# Patient Record
Sex: Female | Born: 1965 | Race: White | Hispanic: No | Marital: Married | Smoking: Never smoker
Health system: Southern US, Community
[De-identification: ages and names within clinical notes are randomized; demographics above are authoritative.]

## PROBLEM LIST (undated history)

## (undated) DIAGNOSIS — K589 Irritable bowel syndrome without diarrhea: Secondary | ICD-10-CM

## (undated) DIAGNOSIS — R011 Cardiac murmur, unspecified: Secondary | ICD-10-CM

## (undated) DIAGNOSIS — K219 Gastro-esophageal reflux disease without esophagitis: Secondary | ICD-10-CM

## (undated) DIAGNOSIS — J309 Allergic rhinitis, unspecified: Secondary | ICD-10-CM

## (undated) DIAGNOSIS — F419 Anxiety disorder, unspecified: Secondary | ICD-10-CM

## (undated) DIAGNOSIS — Z808 Family history of malignant neoplasm of other organs or systems: Secondary | ICD-10-CM

## (undated) DIAGNOSIS — E785 Hyperlipidemia, unspecified: Secondary | ICD-10-CM

## (undated) DIAGNOSIS — M545 Low back pain, unspecified: Secondary | ICD-10-CM

## (undated) DIAGNOSIS — Z800 Family history of malignant neoplasm of digestive organs: Secondary | ICD-10-CM

## (undated) DIAGNOSIS — Z803 Family history of malignant neoplasm of breast: Secondary | ICD-10-CM

## (undated) HISTORY — DX: Family history of malignant neoplasm of breast: Z80.3

## (undated) HISTORY — DX: Family history of malignant neoplasm of digestive organs: Z80.0

## (undated) HISTORY — DX: Anxiety disorder, unspecified: F41.9

## (undated) HISTORY — DX: Hyperlipidemia, unspecified: E78.5

## (undated) HISTORY — DX: Gastro-esophageal reflux disease without esophagitis: K21.9

## (undated) HISTORY — DX: Low back pain: M54.5

## (undated) HISTORY — DX: Low back pain, unspecified: M54.50

## (undated) HISTORY — DX: Cardiac murmur, unspecified: R01.1

## (undated) HISTORY — DX: Irritable bowel syndrome, unspecified: K58.9

## (undated) HISTORY — DX: Family history of malignant neoplasm of other organs or systems: Z80.8

## (undated) HISTORY — PX: EYE SURGERY: SHX253

## (undated) HISTORY — DX: Allergic rhinitis, unspecified: J30.9

## (undated) HISTORY — PX: TUBAL LIGATION: SHX77

## (undated) HISTORY — PX: COLONOSCOPY: SHX174

## (undated) HISTORY — PX: VAGINAL HYSTERECTOMY: SUR661

---

## 1999-05-31 ENCOUNTER — Other Ambulatory Visit: Admission: RE | Admit: 1999-05-31 | Discharge: 1999-05-31 | Payer: Self-pay | Admitting: Obstetrics and Gynecology

## 2001-07-27 ENCOUNTER — Other Ambulatory Visit: Admission: RE | Admit: 2001-07-27 | Discharge: 2001-07-27 | Payer: Self-pay | Admitting: Obstetrics and Gynecology

## 2003-10-09 ENCOUNTER — Other Ambulatory Visit: Admission: RE | Admit: 2003-10-09 | Discharge: 2003-10-09 | Payer: Self-pay | Admitting: Obstetrics and Gynecology

## 2004-10-20 ENCOUNTER — Other Ambulatory Visit: Admission: RE | Admit: 2004-10-20 | Discharge: 2004-10-20 | Payer: Self-pay | Admitting: Obstetrics and Gynecology

## 2004-12-01 ENCOUNTER — Encounter (INDEPENDENT_AMBULATORY_CARE_PROVIDER_SITE_OTHER): Payer: Self-pay | Admitting: Specialist

## 2004-12-01 ENCOUNTER — Observation Stay (HOSPITAL_COMMUNITY): Admission: RE | Admit: 2004-12-01 | Discharge: 2004-12-02 | Payer: Self-pay | Admitting: Obstetrics and Gynecology

## 2005-10-19 ENCOUNTER — Ambulatory Visit: Payer: Self-pay | Admitting: Pulmonary Disease

## 2005-12-07 ENCOUNTER — Other Ambulatory Visit: Admission: RE | Admit: 2005-12-07 | Discharge: 2005-12-07 | Payer: Self-pay | Admitting: Obstetrics and Gynecology

## 2005-12-28 ENCOUNTER — Ambulatory Visit: Payer: Self-pay | Admitting: Pulmonary Disease

## 2006-08-16 ENCOUNTER — Ambulatory Visit: Payer: Self-pay | Admitting: Pulmonary Disease

## 2007-01-01 ENCOUNTER — Ambulatory Visit: Payer: Self-pay | Admitting: Pulmonary Disease

## 2007-01-01 LAB — CONVERTED CEMR LAB
AST: 22 units/L (ref 0–37)
Basophils Relative: 0.5 % (ref 0.0–1.0)
CO2: 29 meq/L (ref 19–32)
Calcium: 9.2 mg/dL (ref 8.4–10.5)
Chol/HDL Ratio, serum: 2.5
Creatinine, Ser: 0.7 mg/dL (ref 0.4–1.2)
Eosinophil percent: 3 % (ref 0.0–5.0)
GFR calc non Af Amer: 98 mL/min
Glomerular Filtration Rate, Af Am: 118 mL/min/{1.73_m2}
Hemoglobin: 12 g/dL (ref 12.0–15.0)
LDL Cholesterol: 83 mg/dL (ref 0–99)
MCHC: 33.3 g/dL (ref 30.0–36.0)
MCV: 90.5 fL (ref 78.0–100.0)
Monocytes Absolute: 0.5 10*3/uL (ref 0.2–0.7)
Monocytes Relative: 10.1 % (ref 3.0–11.0)
Neutro Abs: 2.9 10*3/uL (ref 1.4–7.7)
RDW: 12.5 % (ref 11.5–14.6)
TSH: 2.72 microintl units/mL (ref 0.35–5.50)
Total Bilirubin: 0.9 mg/dL (ref 0.3–1.2)
Total Protein: 6.7 g/dL (ref 6.0–8.3)
Triglyceride fasting, serum: 46 mg/dL (ref 0–149)
VLDL: 9 mg/dL (ref 0–40)
WBC: 4.6 10*3/uL (ref 4.5–10.5)

## 2008-03-14 ENCOUNTER — Telehealth (INDEPENDENT_AMBULATORY_CARE_PROVIDER_SITE_OTHER): Payer: Self-pay | Admitting: *Deleted

## 2008-04-14 DIAGNOSIS — J309 Allergic rhinitis, unspecified: Secondary | ICD-10-CM | POA: Insufficient documentation

## 2008-04-14 DIAGNOSIS — K219 Gastro-esophageal reflux disease without esophagitis: Secondary | ICD-10-CM

## 2008-04-14 DIAGNOSIS — K589 Irritable bowel syndrome without diarrhea: Secondary | ICD-10-CM

## 2008-04-14 DIAGNOSIS — F411 Generalized anxiety disorder: Secondary | ICD-10-CM

## 2008-04-14 DIAGNOSIS — M545 Low back pain: Secondary | ICD-10-CM

## 2008-04-14 DIAGNOSIS — E7800 Pure hypercholesterolemia, unspecified: Secondary | ICD-10-CM

## 2008-04-15 ENCOUNTER — Ambulatory Visit: Payer: Self-pay | Admitting: Pulmonary Disease

## 2008-04-16 ENCOUNTER — Ambulatory Visit: Payer: Self-pay | Admitting: Pulmonary Disease

## 2008-04-20 LAB — CONVERTED CEMR LAB
ALT: 18 units/L (ref 0–35)
AST: 22 units/L (ref 0–37)
Alkaline Phosphatase: 41 units/L (ref 39–117)
BUN: 10 mg/dL (ref 6–23)
CO2: 29 meq/L (ref 19–32)
Direct LDL: 140.6 mg/dL
Eosinophils Absolute: 0.2 10*3/uL (ref 0.0–0.7)
GFR calc Af Amer: 117 mL/min
GFR calc non Af Amer: 97 mL/min
Glucose, Bld: 86 mg/dL (ref 70–99)
HCT: 36.8 % (ref 36.0–46.0)
MCHC: 33.9 g/dL (ref 30.0–36.0)
Monocytes Relative: 10 % (ref 3.0–12.0)
Neutrophils Relative %: 56.7 % (ref 43.0–77.0)
Sodium: 139 meq/L (ref 135–145)
TSH: 2.41 microintl units/mL (ref 0.35–5.50)
Total Bilirubin: 0.9 mg/dL (ref 0.3–1.2)
Total CHOL/HDL Ratio: 3.5

## 2008-10-07 ENCOUNTER — Telehealth (INDEPENDENT_AMBULATORY_CARE_PROVIDER_SITE_OTHER): Payer: Self-pay | Admitting: *Deleted

## 2008-10-09 ENCOUNTER — Ambulatory Visit: Payer: Self-pay | Admitting: Pulmonary Disease

## 2008-10-16 ENCOUNTER — Telehealth: Payer: Self-pay | Admitting: Pulmonary Disease

## 2008-10-16 LAB — CONVERTED CEMR LAB
ALT: 19 units/L (ref 0–35)
Alkaline Phosphatase: 49 units/L (ref 39–117)
Cholesterol: 156 mg/dL (ref 0–200)
HDL: 65.9 mg/dL (ref >39.0)
Total Bilirubin: 0.9 mg/dL (ref 0.3–1.2)
Triglycerides: 49 mg/dL (ref 0–149)

## 2009-09-01 ENCOUNTER — Telehealth (INDEPENDENT_AMBULATORY_CARE_PROVIDER_SITE_OTHER): Payer: Self-pay | Admitting: *Deleted

## 2009-10-09 ENCOUNTER — Ambulatory Visit: Payer: Self-pay | Admitting: Pulmonary Disease

## 2009-10-10 LAB — CONVERTED CEMR LAB
AST: 25 units/L (ref 0–37)
Alkaline Phosphatase: 50 units/L (ref 39–117)
Basophils Relative: 0.9 % (ref 0.0–3.0)
Bilirubin Urine: NEGATIVE
Bilirubin, Direct: 0.1 mg/dL (ref 0.0–0.3)
CO2: 29 meq/L (ref 19–32)
Calcium: 9.3 mg/dL (ref 8.4–10.5)
Cholesterol: 161 mg/dL (ref 0–200)
Eosinophils Relative: 1.6 % (ref 0.0–5.0)
Glucose, Bld: 88 mg/dL (ref 70–99)
Hemoglobin: 13.4 g/dL (ref 12.0–15.0)
Ketones, ur: NEGATIVE mg/dL
Leukocytes, UA: NEGATIVE
Lymphocytes Relative: 25.1 % (ref 12.0–46.0)
Lymphs Abs: 1.5 10*3/uL (ref 0.7–4.0)
MCHC: 34.4 g/dL (ref 30.0–36.0)
MCV: 94.3 fL (ref 78.0–100.0)
Monocytes Relative: 10 % (ref 3.0–12.0)
Nitrite: NEGATIVE
Potassium: 4.1 meq/L (ref 3.5–5.1)
RDW: 11.9 % (ref 11.5–14.6)
Specific Gravity, Urine: 1.015 (ref 1.000–1.030)
TSH: 2.26 microintl units/mL (ref 0.35–5.50)
Total Protein, Urine: NEGATIVE mg/dL
Triglycerides: 67 mg/dL (ref 0.0–149.0)
Urobilinogen, UA: 0.2 (ref 0.0–1.0)
WBC: 5.9 10*3/uL (ref 4.5–10.5)
pH: 7 (ref 5.0–8.0)

## 2010-12-23 ENCOUNTER — Telehealth (INDEPENDENT_AMBULATORY_CARE_PROVIDER_SITE_OTHER): Payer: Self-pay | Admitting: *Deleted

## 2011-01-20 NOTE — Progress Notes (Signed)
Summary: refill and lab appt-PT RETURNED CALL FROM TRIAGE  Phone Note Call from Patient Call back at Work Phone [phone]   Caller: Patient Call For: nadel Reason for Call: Refill Medication Summary of Call: Pt requests refill on simvastatin 20mg  has appt on 2/24 and wants to schedule her labs prior to this appt.//[facility] [address] Initial call taken by: Darletta Moll,  December 23, 2010 1:34 PM  Follow-up for Phone Call        lmomtcb. need to verify medication request and pharmacy. Zackery Barefoot CMA  December 23, 2010 3:55 PM   pt returned call from triage. she confirmed that she wanted simvastatin 20mg  for a 30 day supply w/ [facility] [address]. "If" dr Kriste Basque agrees for a 90 day supply she would like this through [facility]. she says she assumed that the reason the rx was refused last (for 90 days supply req) was because she needed to be seen. pt does have an appt pend w/ nadel. call cell [phone] at 5:20 and pt can speak to nurse then. thanks. Tivis Ringer, CNA  December 23, 2010 5:08 PM  Additional Follow-up for Phone Call Additional follow up Details #1::        Spoke with pt and advised that 90 day supply was sent to  [facility] for simvastatin, needs to be sure and keep appt for refills.  She wants to come in the wk of 02/07/11 to have fasting labs.  Pls advise what labs to order in [facility] thanks! Additional Follow-up by: Vernie Murders,  December 23, 2010 5:17 PM    Additional Follow-up for Phone Call Additional follow up Details #2::    per SN---labs ok for lip-bmp-hepat-cbcd-tsh---v70.0.  thanks Randell Loop CMA  December 24, 2010 9:52 AM   [facility] for pt that lab orders were in computer for week of 02-07-11.Abigail Miyamoto RN  December 24, 2010 10:24 AM   Prescriptions: SIMVASTATIN 20 MG TABS (SIMVASTATIN) 1 by mouth at bedtime  #90 x 0   Entered by:   Vernie Murders   Authorized by:   Michele Mcalpine MD   Signed by:   Vernie Murders on 12/23/2010   Method used:   Electronically to   [facility]* (retail)             ,          Ph: [phone]       Fax: [phone]   RxID:   [id number]

## 2011-02-08 ENCOUNTER — Other Ambulatory Visit: Payer: 59

## 2011-02-08 ENCOUNTER — Other Ambulatory Visit: Payer: Self-pay | Admitting: Pulmonary Disease

## 2011-02-08 ENCOUNTER — Encounter (INDEPENDENT_AMBULATORY_CARE_PROVIDER_SITE_OTHER): Payer: Self-pay | Admitting: *Deleted

## 2011-02-08 DIAGNOSIS — Z0000 Encounter for general adult medical examination without abnormal findings: Secondary | ICD-10-CM

## 2011-02-08 LAB — BASIC METABOLIC PANEL
BUN: 19 mg/dL (ref 6–23)
CO2: 29 mEq/L (ref 19–32)
Calcium: 9.7 mg/dL (ref 8.4–10.5)
Creatinine, Ser: 0.6 mg/dL (ref 0.4–1.2)
Glucose, Bld: 80 mg/dL (ref 70–99)
Potassium: 5.2 mEq/L — ABNORMAL HIGH (ref 3.5–5.1)
Sodium: 139 mEq/L (ref 135–145)

## 2011-02-08 LAB — LIPID PANEL
HDL: 70.2 mg/dL (ref >39.00)
LDL Cholesterol: 107 mg/dL — ABNORMAL HIGH (ref 0–99)

## 2011-02-08 LAB — HEPATIC FUNCTION PANEL
ALT: 17 U/L (ref 0–35)
Alkaline Phosphatase: 40 U/L (ref 39–117)
Bilirubin, Direct: 0.1 mg/dL (ref 0.0–0.3)
Total Protein: 6.9 g/dL (ref 6.0–8.3)

## 2011-02-08 LAB — CBC WITH DIFFERENTIAL/PLATELET
Basophils Absolute: 0 10*3/uL (ref 0.0–0.1)
Eosinophils Absolute: 0.1 10*3/uL (ref 0.0–0.7)
Lymphocytes Relative: 25.6 % (ref 12.0–46.0)
MCHC: 34.1 g/dL (ref 30.0–36.0)
Neutrophils Relative %: 62.7 % (ref 43.0–77.0)
Platelets: 274 10*3/uL (ref 150.0–400.0)
RDW: 13.1 % (ref 11.5–14.6)

## 2011-02-11 ENCOUNTER — Encounter (INDEPENDENT_AMBULATORY_CARE_PROVIDER_SITE_OTHER): Payer: 59 | Admitting: Pulmonary Disease

## 2011-02-11 ENCOUNTER — Encounter: Payer: Self-pay | Admitting: Pulmonary Disease

## 2011-02-11 DIAGNOSIS — Z0000 Encounter for general adult medical examination without abnormal findings: Secondary | ICD-10-CM

## 2011-03-01 NOTE — Assessment & Plan Note (Signed)
Summary: cpx/jd   CC:  16 month ROV & CPX....  History of Present Illness: 45 y/o WF here for a follow up visit and CPX...    ~  [name]:  she was last seen [name] doing well... we decided to start Lipitor 10mg /d for her Chol with a great response, but she has since discontinued this med in favor of diet alone... we did f/u FLP w/ TChol 227, LDL 141- started Simva20...   ~  October 09, 2009:  she's had a good 43mo, tolerating Simva20 & working well... had plantar fasciitis w/ eval by [facility]- stretching, PT, NSAIDs Prn... no new complaints or concerns...   ~  February 11, 2011:  here for CPX & 58mo ROV doing well w/o new complaints or concerns... she does c/o some incr gas & we discussed antigas rx w/ simethacone, etc... she continues to do well on the Simva20...    Current Problem List  PHYSICAL EXAMINATION (ICD-V70.0) - GYN = DrTomblin for Pap, Mammogram, etc... she is up to date w/ her check ups...  ALLERGIC RHINITIS (ICD-477.9) - OTC Claritin/ Zyrtek as needed.  HYPERCHOLESTEROLEMIA (ICD-272.0) - now on SIMVASTATIN 20mg /d...   ~  prev FLP's w/ TChol 230-240, TG 60's, HDL 68-70, LDL 160 range.  ~  FLP on statin Rx showed TChol 150-170, TG 3-60, HDL 61-67, LDL 83-92  ~  FLP 4/09 showed TChol 227, TG 58, HDL 65, LDL 141... rec> start SIMVA20.  ~  FLP 10/09 on Simva20 showed TChol 156, TG 49, HDL 66, LDL 80  ~  FLP 10/10 on Simva20 showed TChol 161, TG 67, HDL 59, LDL 89  ~  FLP 2/12 on Simva20 showed TCHol 198, TG 102, HDL 70, LDL 107... we reviewed diet/ exercise.  GERD (ICD-530.81)no recent problems... prev used Prilosec vs Ranitadine...  IRRITABLE BOWEL SYNDROME (ICD-564.1) - on recent symptoms, uses fiber Prn...  BACK PAIN, LUMBAR (ICD-724.2) - hx lumbar disc dis, no surgery... developed plantar fasciitis 2010 w/ eval by [facility]- stretching, PT, NSAIDs Prn...  ANXIETY (ICD-300.00)   Preventive Screening-Counseling & Management  Alcohol-Tobacco     Smoking Status:  never  Allergies: 1)  ! Codeine  Comments:  Nurse/Medical Assistant: The patient's medications and allergies were reviewed with the patient and were updated in the Medication and Allergy Lists.  Past History:  Past Medical History: ALLERGIC RHINITIS (ICD-477.9) HYPERCHOLESTEROLEMIA (ICD-272.0) GERD (ICD-530.81) IRRITABLE BOWEL SYNDROME (ICD-564.1) BACK PAIN, LUMBAR (ICD-724.2) ANXIETY (ICD-300.00)  Family History: Reviewed history from 10/09/2009 and no changes required. Father alive age 58 w/ hx hypercholesterolemia on meds. Mother died age 63 w/ pancreatic cancer & hx breast cancer. 1 Sis w/ hx psyche prob/ anxiety. Note: hx of colon cancer in 2 uncles and 1 aunt.  Social History: Reviewed history from 10/09/2009 and no changes required. Married, husb= Onalee Hua (fireman), 78yrs. 2 Children- sons aged 94 & 83, both interested in [place]. never smoked social alcohol  exercises 3 times per week Employ: systems analyst [employer]  Review of Systems       The patient complains of gas/bloating.  The patient denies fever, chills, sweats, anorexia, fatigue, weakness, malaise, weight loss, sleep disorder, blurring, diplopia, eye irritation, eye discharge, vision loss, eye pain, photophobia, earache, ear discharge, tinnitus, decreased hearing, nasal congestion, nosebleeds, sore throat, hoarseness, chest pain, palpitations, syncope, dyspnea on exertion, orthopnea, PND, peripheral edema, cough, dyspnea at rest, excessive sputum, hemoptysis, wheezing, pleurisy, nausea, vomiting, diarrhea, constipation, change in bowel habits, abdominal pain, melena, hematochezia, jaundice, indigestion/heartburn, dysphagia, odynophagia, dysuria,  hematuria, urinary frequency, urinary hesitancy, nocturia, incontinence, back pain, joint pain, joint swelling, muscle cramps, muscle weakness, stiffness, arthritis, sciatica, restless legs, leg pain at night, leg pain with exertion, rash, itching, dryness,  suspicious lesions, paralysis, paresthesias, seizures, tremors, vertigo, transient blindness, frequent falls, frequent headaches, difficulty walking, depression, anxiety, memory loss, confusion, cold intolerance, heat intolerance, polydipsia, polyphagia, polyuria, unusual weight change, abnormal bruising, bleeding, enlarged lymph nodes, urticaria, allergic rash, hay fever, and recurrent infections.    Vital Signs:  Patient profile:   45 year old female Height:      66.5 inches Weight:      146.38 pounds BMI:     23.36 O2 Sat:      99 % on Room air Temp:     98.3 degrees F oral Pulse rate:   60 / minute BP sitting:   108 / 68  (left arm) Cuff size:   regular  Vitals Entered By: Randell Loop CMA (February 11, 2011 11:07 AM)  O2 Sat at Rest %:  99 O2 Flow:  Room air CC: 16 month ROV & CPX... Is Patient Diabetic? No Pain Assessment Patient in pain? no      Comments meds updated today with pt   Physical Exam  Additional Exam:  WD, WN, 45 y/o WF in NAD... GENERAL:  Alert & oriented; pleasant & cooperative... HEENT:  [place]/AT, EOM-wnl, PERRLA, EACs-clear, TMs-wnl, NOSE-clear, THROAT-clear & wnl. NECK:  Supple w/ full ROM; no JVD; normal carotid impulses w/o bruits; no thyromegaly or nodules palpated; no lymphadenopathy. CHEST:  Clear to P & A; without wheezes/ rales/ or rhonchi. HEART:  Regular Rhythm; without murmurs/ rubs/ or gallops. ABDOMEN:  Soft & nontender; normal bowel sounds; no organomegaly or masses detected. EXT: without deformities or arthritic changes; no varicose veins/ venous insuffic/ or edema. NEURO:  CN's intact; motor testing normal; sensory testing normal; gait normal & balance OK. DERM:  No lesions noted; no rash etc...    Impression & Recommendations:  Problem # 1:  PHYSICAL EXAMINATION (ICD-V70.0) Good general health... Orders: EKG w/ Interpretation (93000) Fasting labs all done 02/08/11 & copy given to pt...  Problem # 2:  HYPERCHOLESTEROLEMIA  (ICD-272.0) Stable on the Simva20>  needs better diet + exercise, same med for now. Her updated medication list for this problem includes:    Simvastatin 20 Mg Tabs (Simvastatin) .Marland Kitchen... 1 by mouth at bedtime  Problem # 3:  GERD (ICD-530.81) GI stable on OTC meds Prn... advised Simethacone for gas related symptoms...  Problem # 4:  OTHER MEDICAL ISSUES AS NOTED>>>  Complete Medication List: 1)  Simvastatin 20 Mg Tabs (Simvastatin) .Marland Kitchen.. 1 by mouth at bedtime  Patient Instructions: 1)  Today we updated your med list- see below.... 2)  We refilled your Simvastatin... Keep up the good work w/ diet & exercise.Marland KitchenMarland Kitchen 3)  Call for any problems.Marland KitchenMarland Kitchen 4)  Please schedule a follow-up appointment in 1 year. Prescriptions: SIMVASTATIN 20 MG TABS (SIMVASTATIN) 1 by mouth at bedtime  #90 x 4   Entered and Authorized by:   Michele Mcalpine MD   Signed by:   Michele Mcalpine MD on 02/11/2011   Method used:   Print then Give to Patient   RxID:   [id number]

## 2011-05-06 NOTE — Discharge Summary (Signed)
Kylie Miller, Kylie Miller                ACCOUNT NO.:  [id number]   MEDICAL RECORD NO.:  [id number]          PATIENT TYPE:  OBV   LOCATION:  9318                          FACILITY:  [facility]   PHYSICIAN:  Guy Sandifer. Tomblin II, M.D.DATE OF BIRTH:  Apr 23, 1966   DATE OF ADMISSION:  12/01/2004  DATE OF DISCHARGE:                                 DISCHARGE SUMMARY   ADMITTING DIAGNOSIS:  Menorrhagia.   DISCHARGE DIAGNOSIS:  Menorrhagia.   PROCEDURE:  On December 01, 2004 - laparoscopic-assisted vaginal  hysterectomy.   REASON FOR ADMISSION:  This patient is a 45 year old married white female G2  P2 status post tubal ligation with increasingly heavy menses.  Details are  dictated in the History and Physical.  She is admitted to the hospital for  surgical management.   HOSPITAL COURSE:  The patient is admitted to the hospital, undergoes the  above procedure.  On the evening of surgery, she has good pain relief,  tolerating liquids well, but not yet passing flatus.  Vital signs are  stable.  She is afebrile with clear urine output.  On postoperative day #1,  she is tolerating a regular diet, passing flatus, ambulating well.  Vital  signs are stable, she remains afebrile.  White count is 7.9, hemoglobin is  10.7, and pathology is pending.   CONDITION ON DISCHARGE:  Good.   DIET:  Regular as tolerated.   ACTIVITY:  No lifting, no operation of automobiles, no vaginal entry.  She  is to call the office for problems including but not limited to heavy  vaginal bleeding, increasing pain, persistent nausea or vomiting, or  temperature of 101 degrees.   MEDICATIONS:  1.  Percocet 5/325 mg #30 one to two p.o. q.6h. p.r.n.  2.  Ibuprofen 600 mg q.6h. p.r.n.  3.  Multivitamin daily.  4.  Colace daily as needed.   Follow-up is in the office in 2 weeks.     Jame   JET/MEDQ  D:  12/02/2004  T:  12/02/2004  Job:  [id number]

## 2011-05-06 NOTE — H&P (Signed)
Kylie Miller, Kylie Miller NO.:  [id number]   MEDICAL RECORD NO.:  [id number]           PATIENT TYPE:   LOCATION:                                FACILITY:  [facility]   PHYSICIAN:  Guy Sandifer. Tomblin II, M.D.DATE OF BIRTH:  [birth year]   DATE OF ADMISSION:  12/01/2004  DATE OF DISCHARGE:                                HISTORY & PHYSICAL   CHIEF COMPLAINT:  Heavy vaginal bleeding.   HISTORY OF PRESENT ILLNESS:  The patient is a 45 year old married white  female, G2, P2, status post tubal ligation, husband also status post  vasectomy, who continues to have extremely heavy menses each month.  Ultrasound on November 4, __________ revealed the uterus measuring 9.3 x 4.1  x 6.0 cm.  Sonohysterogram was negative for intracavitary masses.  The right  ovary at the time contained a 1.7-cm simple cyst.  Options were carefully  discussed with the patient and she is being admitted for laparoscopically  assisted vaginal hysterectomy and removal of 1 tube and ovary if abnormal.   PAST MEDICAL HISTORY:  Superficial varicosities.   PAST SURGICAL HISTORY:  Negative.   OBSTETRICAL HISTORY:  Vaginal delivery x1.  Cesarean section with tubal  ligation x1.   MEDICATIONS:  Antibiotics for acne.   ALLERGIES:  No known drug allergies.   SOCIAL HISTORY:  Denies tobacco, alcohol or drug abuse.   FAMILY HISTORY:  Pancreatic and breast cancer in mother.  Colon cancer in  paternal grandfather.  Thyroid cancer in maternal grandmother.  Colon cancer  in maternal aunt.  Colon cancer in paternal uncle.  Mild epilepsy in sister.  Abnormal thyroid function in mother.   REVIEW OF SYSTEMS:  NEUROLOGIC:  Denies headache.  CARDIAC:  Denies chest  pain.  PULMONARY:  Denies shortness of breath.  GI:  Denies recent changes  in bowel habits.   PHYSICAL EXAMINATION:  VITAL SIGNS:  Height 5 feet 5-1/2 inches.  Weight 146  pounds.  Blood pressure 110/54.  HEENT/NECK:  Without thyromegaly.  LUNGS:  Clear to  auscultation.  HEART:  Regular rate and rhythm.  BACK:  Back without CVA tenderness.  BREASTS:  Breasts without masses, traction or discharge.  ABDOMEN:  Abdomen soft and nontender without masses.  PELVIC:  Vulva, vagina and cervix without lesion.  Uterus is upper limits of  normal size, mobile and nontender.  Adnexa nontender without masses.  EXTREMITIES AND NEUROLOGICAL EXAM:  Grossly within normal limits.   ASSESSMENT:  Menorrhagia.   PLAN:  Laparoscopically assisted vaginal hysterectomy, removal of tube and  ovary if abnormal.      JET/MEDQ  D:  11/22/2004  T:  11/22/2004  Job:  [id number]

## 2011-05-06 NOTE — Op Note (Signed)
Kylie Miller, Kylie Miller                ACCOUNT NO.:  [id number]   MEDICAL RECORD NO.:  [id number]          PATIENT TYPE:  OBV   LOCATION:  9399                          FACILITY:  [facility]   PHYSICIAN:  Guy Sandifer. Tomblin II, M.D.DATE OF BIRTH:  Sep 22, 1966   DATE OF PROCEDURE:  12/01/2004  DATE OF DISCHARGE:                                 OPERATIVE REPORT   PREOPERATIVE DIAGNOSIS:  Menorrhagia.   POSTOPERATIVE DIAGNOSIS:  Menorrhagia.   PROCEDURE:  Laparoscopically assisted vaginal hysterectomy.   SURGEON:  Guy Sandifer. Henderson Cloud, M.D.   ASSISTANT:  Raynald Kemp, M.D.   ANESTHESIA:  General with endotracheal intubation.   ESTIMATED BLOOD LOSS:  200 cc.   SPECIMENS:  Uterus.   INDICATIONS AND CONSENT:  This patient is a 45 year old married white female,  G2, P2, status post tubal ligation who continues to have extremely heavy  menses.  Details are dictated in the history and physical.  Laparoscopically  assisted vaginal hysterectomy and removal of the tube and ovary if abnormal  has been discussed.  The potential risks and complications have been  reviewed preoperatively, including but not limited to infection, bowel,  bladder, or ureteral damage; bleeding requiring transfusion or blood  products with possible transfusion reaction, HIV, and hepatitis acquisition;  DVT, PE, pneumonia; fistula formation; laparotomy; postoperative pain or  dyspareunia.  All questions have been answered and consent signed and on the  chart.   FINDINGS:  The upper abdomen was grossly normal.  The uterus was about six  weeks in size, smooth in contour.  The anterior and posterior cul-de-sacs  were normal.  The ovaries were normal.  The tubes were status post ligation  bilaterally.   DESCRIPTION OF PROCEDURE:  The patient was taken to the operating room where  she was identified and placed in the dorsal supine position.  General  anesthesia was induced via endotracheal intubation.  She was then placed in  the  dorsal lithotomy position where she was prepped abdominally and  vaginally.  The bladder was straight catheterized.  A Hulka tenaculum was  placed and used as a uterine manipulator, and she was draped in sterile  fashion.   A small infraumbilical incision was made, and a disposable Veress needle was  placed without difficulty.  Syringe and drop test were normal.  2 L of gas  were insufflated under low pressure.  Good tympany in the right upper  quadrant was noted.  The Veress needle was removed was replaced with a 10/11  disposable trocar sleeve.  Placement was verified with the laparoscope, and  no damage to surrounding structures were noted.  Pneumoperitoneum was  induced.  A small suprapubic incision was made, and a 5-mm disposable trocar  sleeve was placed under direct visualization without difficulty.  The above  findings were noted.  Then using the Gyrus bipolar cautery cutting  instrument, the proximal ligaments were taken down bilaterally to the level  of the vesicouterine peritoneum.  The vesicouterine peritoneum was incised  in the midline, hydrodissected, and taken down bilaterally sharply.  Good  hemostasis was maintained.  Excess  fluid was removed.  The suprapubic trocar  sleeve was removed.  Pneumoperitoneum was reduced, and attention was turned  to the vagina.   The posterior cul-de-sac was entered sharply, and the cervix was  circumscribed with unipolar cautery.  The mucosa was advanced sharply and  bluntly.  The uterosacral ligaments were taken down with the Gyrus bipolar  instrument.  The anterior cul-de-sac was entered without difficulty.  The  bladder pillars followed by the cardinal ligaments followed by the uterine  vessels were taken bilaterally.  The fundus was delivered posteriorly.  The  proximal ligaments were taken down, and the specimen was delivered.  The  uterosacral ligaments were plicated to the vagina bilaterally and then  plicated in the midline with a  separate suture.  The vaginal cuff was closed  with figure-of-eights.  A Foley catheter was placed in the bladder, and  clear urine was noted.   Attention was returned to the abdomen.  Pneumoperitoneum was reintroduced.  The suprapubic trocar sleeve was reinserted under direct visualization.  Careful inspection revealed good hemostasis.  Inspection under reduced  pneumoperitoneum also confirmed good hemostasis.  Excess fluid was removed.  All instruments were removed.  Both incisions were injected with 0.5% plain  Marcaine, and Dermabond was placed on both incisions.  All counts were  correct.   The patient was awakened and taken to the recovery room in stable condition.     Jame   JET/MEDQ  D:  12/01/2004  T:  12/01/2004  Job:  [id number]

## 2012-01-27 ENCOUNTER — Encounter: Payer: Self-pay | Admitting: Internal Medicine

## 2012-01-27 ENCOUNTER — Telehealth: Payer: Self-pay | Admitting: Pulmonary Disease

## 2012-01-27 DIAGNOSIS — K921 Melena: Secondary | ICD-10-CM

## 2012-01-27 MED ORDER — HYDROCORTISONE 2.5 % RE CREA: TOPICAL_CREAM | RECTAL | Status: DC

## 2012-01-27 NOTE — Telephone Encounter (Signed)
Called and spoke with pt and she stated that about 6-8 wks ago she thought she noticed blood in her stools.  She then thought maybe it was just from the food she was eating.  Pt started with excessive gas this week and has now noticed blood in her stools again--she stated that she has noticed some bright blood and dark blood.  She is worried about this and wanted to find out what SN recs were.  Pt is aware that i will call her back.

## 2012-01-27 NOTE — Telephone Encounter (Signed)
lmomtcb for pt---per SN---ok to call in anusol HC cream  Apply to rectum after each BM and at bedtime.  We will send in referral for her to have appt with GI for eval.

## 2012-01-27 NOTE — Telephone Encounter (Signed)
Returning call can be reached at [phone].Kylie Miller

## 2012-01-27 NOTE — Telephone Encounter (Signed)
Called and spoke with pt and she is aware of SN recs.  We have sent in the anusol cream to her pharmacy and she is aware that we will call her with appt for GI.

## 2012-02-08 ENCOUNTER — Encounter: Payer: Self-pay | Admitting: Internal Medicine

## 2012-02-10 ENCOUNTER — Other Ambulatory Visit (INDEPENDENT_AMBULATORY_CARE_PROVIDER_SITE_OTHER): Payer: 59

## 2012-02-10 ENCOUNTER — Encounter: Payer: Self-pay | Admitting: Internal Medicine

## 2012-02-10 ENCOUNTER — Ambulatory Visit (INDEPENDENT_AMBULATORY_CARE_PROVIDER_SITE_OTHER): Payer: 59 | Admitting: Internal Medicine

## 2012-02-10 DIAGNOSIS — Z800 Family history of malignant neoplasm of digestive organs: Secondary | ICD-10-CM

## 2012-02-10 DIAGNOSIS — R198 Other specified symptoms and signs involving the digestive system and abdomen: Secondary | ICD-10-CM

## 2012-02-10 DIAGNOSIS — K625 Hemorrhage of anus and rectum: Secondary | ICD-10-CM

## 2012-02-10 DIAGNOSIS — R194 Change in bowel habit: Secondary | ICD-10-CM

## 2012-02-10 DIAGNOSIS — K921 Melena: Secondary | ICD-10-CM

## 2012-02-10 LAB — CBC WITH DIFFERENTIAL/PLATELET
Basophils Absolute: 0.1 10*3/uL (ref 0.0–0.1)
Eosinophils Relative: 3 % (ref 0.0–5.0)
Lymphocytes Relative: 26.9 % (ref 12.0–46.0)
Monocytes Relative: 9.4 % (ref 3.0–12.0)
Neutrophils Relative %: 59.8 % (ref 43.0–77.0)
Platelets: 270 10*3/uL (ref 150.0–400.0)
WBC: 5.3 10*3/uL (ref 4.5–10.5)

## 2012-02-10 LAB — COMPREHENSIVE METABOLIC PANEL
ALT: 18 U/L (ref 0–35)
Albumin: 4 g/dL (ref 3.5–5.2)
CO2: 27 mEq/L (ref 19–32)
GFR: 100.06 mL/min (ref >60.00)
Glucose, Bld: 82 mg/dL (ref 70–99)
Potassium: 4.4 mEq/L (ref 3.5–5.1)
Sodium: 136 mEq/L (ref 135–145)
Total Protein: 6.9 g/dL (ref 6.0–8.3)

## 2012-02-10 LAB — TSH: TSH: 2.66 u[IU]/mL (ref 0.35–5.50)

## 2012-02-10 LAB — IGA: IgA: 269 mg/dL (ref 68–378)

## 2012-02-10 MED ORDER — PEG-KCL-NACL-NASULF-NA ASC-C 100 G PO SOLR: 1.0000 | Freq: Once | ORAL | Status: DC

## 2012-02-10 NOTE — Patient Instructions (Signed)
You have been scheduled for a colonoscopy on 03/08/12, please see the additional instructions sheets provided for you at the office visit Please go to the basement for lab work today Your Moviprep kit has been sent to your pharmacy

## 2012-02-10 NOTE — Progress Notes (Signed)
Subjective:    Patient ID: Kylie Miller, female    DOB: 04-10-1966, 46 y.o.   MRN: [id number]  HPI Kylie Miller is a 46 yo female with PMH of hypercholesterolemia, GERD who seen in consultation at the request of Dr. Kriste Basque for evaluation of change in bowel habits and bloody stools. The patient reports initially seen blood in her stool approximately 2 months ago, and this has occurred intermittently since. The last time was about 3 weeks ago. She describes the blood as bright red and occasionally dark red. She has not seen clots and denies melena. She reports the bleeding is painless, and she also denies abdominal pain. She does report a significant change in her bowel habits, which seem to date back to May 2012.  Prior to this she was having one bowel movement daily to every other day, but since May 2012 she is having 6-8 bowel movements daily. She reports these are formed to loose in nature. She denies tenesmus. She does report a significant change in her exercise around may 2012 when she started running more frequently. She also notes increased gas and belching, which also date back about 6 months. She reports a good appetite, and approximate 6 pound weight loss in the last year, again which she attributes to increased exercise. No nausea or vomiting. No fevers or chills.  Review of Systems Constitutional: Negative for fever, chills, night sweats, activity change, appetite change and unexpected weight change HEENT: Negative for sore throat, mouth sores and trouble swallowing. Eyes: Negative for visual disturbance Respiratory: Negative for cough, chest tightness and shortness of breath Cardiovascular: Negative for chest pain, palpitations and lower extremity swelling Gastrointestinal: See history of present illness Genitourinary: Negative for dysuria and hematuria. Musculoskeletal: Negative for back pain, arthralgias and myalgias Skin: Negative for rash or color change Neurological: Negative for  headaches, weakness, numbness Hematological: Negative for adenopathy, negative for easy bruising/bleeding Psychiatric/behavioral: Negative for depressed mood, negative for anxiety   Patient Active Problem List  Diagnoses  . HYPERCHOLESTEROLEMIA  . ANXIETY  . ALLERGIC RHINITIS  . GERD  . IRRITABLE BOWEL SYNDROME  . BACK PAIN, LUMBAR   Past Surgical History  Procedure Date  . Vaginal hysterectomy   . Cesarean section    Current Outpatient Prescriptions  Medication Sig Dispense Refill  . simvastatin (ZOCOR) 10 MG tablet Take 10 mg by mouth at bedtime.      . peg 3350 powder (MOVIPREP) 100 G SOLR Take 1 kit (100 g total) by mouth once.  1 kit  0   Allergies  Allergen Reactions  . Codeine     REACTION: nausea   Family History  Problem Relation Age of Onset  . Colon cancer Paternal Uncle     x 2  . Colon cancer Maternal Aunt   . Breast cancer Mother   . Pancreatic cancer Mother   . Heart disease Maternal Grandfather   . Stomach cancer Paternal Grandfather   . Thyroid cancer Maternal Grandmother    History   Social History  . Marital Status: Married    Number of Children: 2   Occupational History  . Systems Analyst    Social History Main Topics  . Smoking status: Never Smoker   . Smokeless tobacco: Never Used  . Alcohol Use: Yes     1 or less  . Drug Use: No      Objective:   Physical Exam BP 112/76  Pulse 78  Ht 5\' 5"  (1.651 m)  Wt 138  lb (62.596 kg)  BMI 22.96 kg/m2 Constitutional: Well-developed and well-nourished. No distress. HEENT: Normocephalic and atraumatic. Oropharynx is clear and moist. No oropharyngeal exudate. Conjunctivae are normal. Pupils are equal round and reactive to light. No scleral icterus. Neck: Neck supple. Trachea midline. Cardiovascular: Normal rate, regular rhythm and intact distal pulses. No M/R/G Pulmonary/chest: Effort normal and breath sounds normal. No wheezing, rales or rhonchi. Abdominal: Soft, nontender, nondistended.  Bowel sounds active throughout. There are no masses palpable. No hepatosplenomegaly. Extremities: no clubbing, cyanosis, or edema Lymphadenopathy: No cervical adenopathy noted. Neurological: Alert and oriented to person place and time. Skin: Skin is warm and dry. No rashes noted. Psychiatric: Normal mood and affect. Behavior is normal.      Assessment & Plan:   46 yo female with PMH of hypercholesterolemia, GERD who seen in consultation at the request of Dr. Kriste Basque for evaluation of change in bowel habits and bloody stools  1. Blood in stool/change in bowel habits -- given the patient's symptoms, I recommended colonoscopy. We discussed the test today including its risks and benefits and she is agreeable to proceed. This will be scheduled for her. The differential includes hemorrhoidal disease, inflammatory bowel disease, or less likely polyp/cancer. Further recommendations to be made after colonoscopy.  2. Family Hx of Panc cancer -- the patient's mother had a history of pancreatic cancer and thus we discussed this today. We have discussed how there are no guidelines currently supporting screening in asymptomatic individuals with a family history of pancreatic cancer. This being based on the fact that, there is no known family history of hereditary pancreatitis or known hereditary/genetic cancer syndromes.  The below is taken from Up To Date and represents a suggestion on individuals to consider pancreatic cancer screening. "An affected individual with PJS,  An affected individual with hereditary pancreatitis,  Three or more first-, second-, or third-degree relatives with pancreatic cancer, with ?1 pancreatic cancer in a first-degree relative*, A known mutation carrier of a BRCA1,. BRCA2, p16, MLH1,. MSH2,. MSH6,. or PMS2. mutation and ?1 first- or second- degree relative with pancreatic cancer"  None of these applies to her at present.  Therefore, screening is deferred for now. She is very comfortable  with this plan.

## 2012-02-13 LAB — TISSUE TRANSGLUTAMINASE, IGA: Tissue Transglutaminase Ab, IgA: 3.7 U/mL (ref <20)

## 2012-03-08 ENCOUNTER — Encounter: Payer: 59 | Admitting: Internal Medicine

## 2012-03-08 ENCOUNTER — Encounter: Payer: Self-pay | Admitting: Internal Medicine

## 2012-03-08 ENCOUNTER — Ambulatory Visit (AMBULATORY_SURGERY_CENTER): Payer: 59 | Admitting: Internal Medicine

## 2012-03-08 VITALS — HR 70 | Temp 98.5°F | Resp 18 | Ht 63.0 in | Wt 138.0 lb

## 2012-03-08 DIAGNOSIS — K625 Hemorrhage of anus and rectum: Secondary | ICD-10-CM

## 2012-03-08 DIAGNOSIS — D126 Benign neoplasm of colon, unspecified: Secondary | ICD-10-CM

## 2012-03-08 DIAGNOSIS — R198 Other specified symptoms and signs involving the digestive system and abdomen: Secondary | ICD-10-CM

## 2012-03-08 DIAGNOSIS — R194 Change in bowel habit: Secondary | ICD-10-CM

## 2012-03-08 DIAGNOSIS — K635 Polyp of colon: Secondary | ICD-10-CM

## 2012-03-08 DIAGNOSIS — K921 Melena: Secondary | ICD-10-CM

## 2012-03-08 MED ORDER — SODIUM CHLORIDE 0.9 % IV SOLN: 500.0000 mL | INTRAVENOUS | Status: DC

## 2012-03-08 NOTE — Progress Notes (Signed)
Patient did not have preoperative order for IV antibiotic SSI prophylaxis. (G8918)  Patient did not experience any of the following events: a burn prior to discharge; a fall within the facility; wrong site/side/patient/procedure/implant event; or a hospital transfer or hospital admission upon discharge from the facility. (G8907)  

## 2012-03-08 NOTE — Patient Instructions (Signed)
YOU HAD AN ENDOSCOPIC PROCEDURE TODAY AT THE [facility]: Refer to the procedure report that was given to you for any specific questions about what was found during the examination.  If the procedure report does not answer your questions, please call your gastroenterologist to clarify.  If you requested that your care partner not be given the details of your procedure findings, then the procedure report has been included in a sealed envelope for you to review at your convenience later.  YOU SHOULD EXPECT: Some feelings of bloating in the abdomen. Passage of more gas than usual.  Walking can help get rid of the air that was put into your GI tract during the procedure and reduce the bloating. If you had a lower endoscopy (such as a colonoscopy or flexible sigmoidoscopy) you may notice spotting of blood in your stool or on the toilet paper. If you underwent a bowel prep for your procedure, then you may not have a normal bowel movement for a few days.  DIET: Your first meal following the procedure should be a light meal and then it is ok to progress to your normal diet.  A half-sandwich or bowl of soup is an example of a good first meal.  Heavy or fried foods are harder to digest and may make you feel nauseous or bloated.  Likewise meals heavy in dairy and vegetables can cause extra gas to form and this can also increase the bloating.  Drink plenty of fluids but you should avoid alcoholic beverages for 24 hours.  ACTIVITY: Your care partner should take you home directly after the procedure.  You should plan to take it easy, moving slowly for the rest of the day.  You can resume normal activity the day after the procedure however you should NOT DRIVE or use heavy machinery for 24 hours (because of the sedation medicines used during the test).    SYMPTOMS TO REPORT IMMEDIATELY: A gastroenterologist can be reached at any hour.  During normal business hours, 8:30 AM to 5:00 PM Monday through Friday,  call [phone].  After hours and on weekends, please call the GI answering service at [phone] who will take a message and have the physician on call contact you.   Following lower endoscopy (colonoscopy or flexible sigmoidoscopy):  Excessive amounts of blood in the stool  Significant tenderness or worsening of abdominal pains  Swelling of the abdomen that is new, acute  Fever of 100F or higher    FOLLOW UP: If any biopsies were taken you will be contacted by phone or by letter within the next 1-3 weeks.  Call your gastroenterologist if you have not heard about the biopsies in 3 weeks.  Our staff will call the home number listed on your records the next business day following your procedure to check on you and address any questions or concerns that you may have at that time regarding the information given to you following your procedure. This is a courtesy call and so if there is no answer at the home number and we have not heard from you through the emergency physician on call, we will assume that you have returned to your regular daily activities without incident.  SIGNATURES/CONFIDENTIALITY: You and/or your care partner have signed paperwork which will be entered into your electronic medical record.  These signatures attest to the fact that that the information above on your After Visit Summary has been reviewed and is understood.  Full responsibility of the confidentiality   of this discharge information lies with you and/or your care-partner.     

## 2012-03-08 NOTE — Op Note (Signed)
 [facility] [address] [place], [place]  [address]  COLONOSCOPY PROCEDURE REPORT  PATIENT:  Kylie, Miller  MR#:  [id number] BIRTHDATE:  1966/08/17, 45 yrs. old  GENDER:  female ENDOSCOPIST:  Carie Caddy. Undra Trembath, MD REF. BY:  Alroy Dust, M.D. PROCEDURE DATE:  03/08/2012 PROCEDURE:  Colonoscopy with snare polypectomy, Colonoscopy with multiple cold biopsies ASA CLASS:  Class I INDICATIONS:  change in bowel habits, blood in stool MEDICATIONS:   propofol (Diprivan) 500 mg IV, MAC sedation, administered by CRNA  DESCRIPTION OF PROCEDURE:   After the risks benefits and alternatives of the procedure were thoroughly explained, informed consent was obtained.  Digital rectal exam was performed and revealed no rectal masses.   The LB [id number]-[id number] endoscope was introduced through the anus and advanced to the cecum, which was identified by both the appendix and ileocecal valve, without limitations.  The quality of the prep was excellent, using MoviPrep.  The instrument was then slowly withdrawn as the colon was fully examined. <<PROCEDUREIMAGES>> FINDINGS:  A 7 mm flat polyp covered with mucus cap was found in the descending colon. Polyp was snared, then cauterized with monopolar cautery. Retrieval was successful.  This was otherwise a normal examination of the colon. Random biopsies were obtained and sent to pathology.  Small Internal Hemorrhoids were found. Retroflexed views in the rectum revealed no other findings other than those already described.   The scope was then withdrawn  from the cecum and the procedure completed.  COMPLICATIONS:  None  ENDOSCOPIC IMPRESSION: 1) Sessile polyp in the descending colon. Removed and sent to pathology. 2) Otherwise normal examination 3) Small internal hemorrhoids  RECOMMENDATIONS: 1) Await pathology results 2) If the polyp removed today is proven to be adenomatous (pre-cancerous) polyp, you will need a repeat colonoscopy in  5 years. Otherwise you should continue to follow colorectal cancer screening guidelines for "routine risk" patients with colonoscopy in 10 years. You will receive a letter within 1-2 weeks with the results of your biopsy as well as final recommendations. Please call my office if you have not received a letter after 3 weeks. 3) Follow up as needed in clinic.  Carie Caddy. Rhea Belton, MD  CC:  The Patient Michele Mcalpine, MD  n. Rosalie DoctorCarie Caddy. Layla Kesling at 03/08/2012 09:41 AM  Anda Kraft, [phone]

## 2012-03-09 ENCOUNTER — Telehealth: Payer: Self-pay | Admitting: *Deleted

## 2012-03-09 NOTE — Telephone Encounter (Signed)
  Follow up Call-  Call back number 03/08/2012  Post procedure Call Back phone  # [phone]  Permission to leave phone message Yes     Patient questions:  Do you have a fever, pain , or abdominal swelling? no Pain Score  0 *  Have you tolerated food without any problems? yes  Have you been able to return to your normal activities? yes  Do you have any questions about your discharge instructions: Diet   no Medications  no Follow up visit  no  Do you have questions or concerns about your Care? no  Actions: * If pain score is 4 or above: No action needed, pain <4.

## 2012-03-15 ENCOUNTER — Encounter: Payer: Self-pay | Admitting: Internal Medicine

## 2012-04-29 ENCOUNTER — Other Ambulatory Visit: Payer: Self-pay | Admitting: Pulmonary Disease

## 2012-05-01 ENCOUNTER — Telehealth: Payer: Self-pay | Admitting: Pulmonary Disease

## 2012-05-01 MED ORDER — SIMVASTATIN 10 MG PO TABS: 10.0000 mg | ORAL_TABLET | Freq: Every day | ORAL | Status: DC

## 2012-05-01 NOTE — Telephone Encounter (Signed)
lmomtcb x1--rx has been sent 

## 2012-05-01 NOTE — Telephone Encounter (Signed)
Pt aware her rx has been sent.Kylie Miller

## 2012-05-03 ENCOUNTER — Telehealth: Payer: Self-pay | Admitting: Pulmonary Disease

## 2012-05-03 NOTE — Telephone Encounter (Signed)
Pt returned call.  Advised that her rx was sent on 5.14.13.  Pt stated that she thought it would have arrived by now and just wanted to check.  Advised pt to give it until tomorrow sometime and if she still does not have it, to either call [facility] or call our office so that we may check on the status of the refill.  Pt okay with these recs and verbalized her understanding.

## 2012-05-03 NOTE — Telephone Encounter (Signed)
lmomtcb for pt 

## 2012-06-12 ENCOUNTER — Other Ambulatory Visit: Payer: Self-pay | Admitting: Pulmonary Disease

## 2012-06-12 ENCOUNTER — Telehealth: Payer: Self-pay | Admitting: Pulmonary Disease

## 2012-06-12 DIAGNOSIS — E7800 Pure hypercholesterolemia, unspecified: Secondary | ICD-10-CM

## 2012-06-12 DIAGNOSIS — Z0000 Encounter for general adult medical examination without abnormal findings: Secondary | ICD-10-CM

## 2012-06-12 NOTE — Telephone Encounter (Signed)
Please let the pt know her lab orders are in the computer for her.  thanks

## 2012-06-12 NOTE — Telephone Encounter (Signed)
lmomtcb  

## 2012-06-12 NOTE — Telephone Encounter (Signed)
Please advise what labs pt will need to have done SN thanks 

## 2012-06-13 ENCOUNTER — Ambulatory Visit (INDEPENDENT_AMBULATORY_CARE_PROVIDER_SITE_OTHER): Payer: 59 | Admitting: Pulmonary Disease

## 2012-06-13 ENCOUNTER — Other Ambulatory Visit (INDEPENDENT_AMBULATORY_CARE_PROVIDER_SITE_OTHER): Payer: 59

## 2012-06-13 ENCOUNTER — Encounter: Payer: Self-pay | Admitting: Pulmonary Disease

## 2012-06-13 VITALS — BP 100/60 | HR 64 | Temp 97.6°F | Ht 66.5 in | Wt 141.0 lb

## 2012-06-13 DIAGNOSIS — Z0000 Encounter for general adult medical examination without abnormal findings: Secondary | ICD-10-CM

## 2012-06-13 LAB — HEPATIC FUNCTION PANEL
ALT: 22 U/L (ref 0–35)
Albumin: 3.9 g/dL (ref 3.5–5.2)
Alkaline Phosphatase: 40 U/L (ref 39–117)
Bilirubin, Direct: 0.1 mg/dL (ref 0.0–0.3)
Total Protein: 6.9 g/dL (ref 6.0–8.3)

## 2012-06-13 LAB — LIPID PANEL
HDL: 75.5 mg/dL (ref >39.00)
Total CHOL/HDL Ratio: 2
Triglycerides: 66 mg/dL (ref 0.0–149.0)

## 2012-06-13 LAB — URINALYSIS, ROUTINE W REFLEX MICROSCOPIC
Leukocytes, UA: NEGATIVE
Nitrite: NEGATIVE
Urobilinogen, UA: 0.2 (ref 0.0–1.0)

## 2012-06-13 LAB — BASIC METABOLIC PANEL
CO2: 28 mEq/L (ref 19–32)
Chloride: 104 mEq/L (ref 96–112)
Creatinine, Ser: 0.8 mg/dL (ref 0.4–1.2)
Glucose, Bld: 85 mg/dL (ref 70–99)

## 2012-06-13 LAB — TSH: TSH: 2.37 u[IU]/mL (ref 0.35–5.50)

## 2012-06-13 LAB — CBC WITH DIFFERENTIAL/PLATELET
Basophils Absolute: 0 10*3/uL (ref 0.0–0.1)
Eosinophils Absolute: 0.1 10*3/uL (ref 0.0–0.7)
Lymphocytes Relative: 18 % (ref 12.0–46.0)
MCHC: 33.2 g/dL (ref 30.0–36.0)
Monocytes Relative: 8 % (ref 3.0–12.0)
Neutrophils Relative %: 72.3 % (ref 43.0–77.0)
RDW: 13.3 % (ref 11.5–14.6)

## 2012-06-13 MED ORDER — SIMVASTATIN 20 MG PO TABS: 20.0000 mg | ORAL_TABLET | Freq: Every evening | ORAL | Status: DC

## 2012-06-13 NOTE — Telephone Encounter (Signed)
LMOM informing the pt that the labs order are in the computer.

## 2012-06-14 ENCOUNTER — Encounter: Payer: Self-pay | Admitting: Pulmonary Disease

## 2012-06-14 NOTE — Patient Instructions (Signed)
Today we updated your med list in our EPIC system...    Continue your current medications the same...  We reviewed your current blood work & gave you a copy for your records...  Call for any problems or if we can be of service in any way.Marland KitchenMarland Kitchen

## 2012-06-14 NOTE — Progress Notes (Signed)
Subjective:     Patient ID: Kylie Miller, female   DOB: Jan 26, 1966, 46 y.o.   MRN: [id number]  HPI 46 y/o WF here for a follow up visit and CPX...   ~  [name]:  she was last seen [name] doing well... we decided to start Lipitor 10mg /d for her Chol with a great response, but she has since discontinued this med in favor of diet alone... we did f/u FLP w/ TChol 227, LDL 141- started Simva20...  ~  October 09, 2009:  she's had a good 27mo, tolerating Simva20 & working well... had plantar fasciitis w/ eval by [facility]- stretching, PT, NSAIDs Prn... no new complaints or concerns...  ~  February 11, 2011:  here for CPX & 48mo ROV doing well w/o new complaints or concerns... she does c/o some incr gas & we discussed antigas rx w/ simethacone, etc... she continues to do well on the Simva20...  ~  June 13, 2012:  33mo ROV & CPX> Yi has had a good yr- feeling well & no new complaints or concerns... CHOL> on Simva20 & f/u FLP looks good, all parameters at goals (see below); continue same... GI> GERD, IBS, Polyp> still notes some gas problems but improved on Simethacone preps; she saw DrPyrtle for Colonoscopy 3/13 w/ one 7mm hyperplastic polyp removed, sm int hems seen, f/u planned 10 yrs...    We reviewed prob list, meds, xrays and labs> see below>> LABS 6/13:  FLP- allparameters at goals on Simva20;  Chems- wnl;  CBC- wnl;  TSH=2.37;  UA- clear   Problem List:  PHYSICAL EXAMINATION (ICD-V70.0) - GYN = DrTomblin for Pap, Mammogram, etc... she is up to date w/ her check ups... ~  CXR 10/10 showed normal heart size, clear lungs, NAD... ~  EKG 2/12 showed NSR, rate67, WNL...  ALLERGIC RHINITIS (ICD-477.9) - OTC Claritin/ Zyrtek as needed.  HYPERCHOLESTEROLEMIA (ICD-272.0) - on SIMVASTATIN 20mg /d + low chol diet... ~  prev FLP's w/ TChol 230-240, TG 60's, HDL 68-70, LDL 160 range. ~  FLP on statin Rx showed TChol 150-170, TG 3-60, HDL 61-67, LDL 83-92 ~  FLP 4/09 showed TChol 227, TG 58, HDL 65,  LDL 141... rec> start SIMVA20. ~  FLP 10/09 on Simva20 showed TChol 156, TG 49, HDL 66, LDL 80 ~  FLP 10/10 on Simva20 showed TChol 161, TG 67, HDL 59, LDL 89 ~  FLP 2/12 on Simva20 showed TChol 198, TG 102, HDL 70, LDL 107... we reviewed diet/ exercise. ~  FLP 6/13 on Simva20 showed TChol 179, TG 66, HDL 76, LDL 90  GERD >> no recent problems... prev used Prilosec vs Ranitadine...  IRRITABLE BOWEL SYNDROME COLON POLYP >> 7mm dec colon polyp removed 2/13= hyperplastic. INTERNAL HEMORRHOIDS ~  She has had complaints of gas & treated w/ OTC Simethacone preps... ~  2/13:  GI eval DrPyrtle for change in bowel habits w/ freq & intermittent blood seen> neg exam, normal labs including Tissue Transglutaminase & IgA level (neg for Celiac dis)... ~  Colonoscopy 2/13 by DrPyrtle 2/13 showed one 7mm polyp in desc colon- hyperplastic, sm int hems... F/u planned 10 yrs.  Family Hx of Pancreatic Cancer >>  ~  the patient's mother had a history of pancreatic cancer and she discussed this w/ DrPyrtle 2/13;  They reviewed how there are no guidelines currently supporting screening in asymptomatic individuals with a family history of pancreatic cancer (this being based on the fact that there is no known family history of hereditary pancreatitis or  known hereditary/genetic cancer syndromes)...  BACK PAIN, LUMBAR (ICD-724.2) - hx lumbar disc dis, no surgery... developed plantar fasciitis 2010 w/ eval by [facility]- stretching, PT, NSAIDs Prn...  ANXIETY (ICD-300.00)   Past Surgical History  Procedure Date  . Vaginal hysterectomy   . Cesarean section     Outpatient Encounter Prescriptions as of 06/13/2012  Medication Sig Dispense Refill  . DISCONTD: simvastatin (ZOCOR) 10 MG tablet Take 1 tablet (10 mg total) by mouth at bedtime.  90 tablet  0  . simvastatin (ZOCOR) 20 MG tablet Take 1 tablet (20 mg total) by mouth every evening.  90 tablet  3    Allergies  Allergen Reactions  . Codeine     REACTION:  nausea    Current Medications, Allergies, Past Medical History, Past Surgical History, Family History, and Social History were reviewed in [employer] record.   Review of Systems        The patient complains of gas/bloating.  The patient denies fever, chills, sweats, anorexia, fatigue, weakness, malaise, weight loss, sleep disorder, blurring, diplopia, eye irritation, eye discharge, vision loss, eye pain, photophobia, earache, ear discharge, tinnitus, decreased hearing, nasal congestion, nosebleeds, sore throat, hoarseness, chest pain, palpitations, syncope, dyspnea on exertion, orthopnea, PND, peripheral edema, cough, dyspnea at rest, excessive sputum, hemoptysis, wheezing, pleurisy, nausea, vomiting, diarrhea, constipation, change in bowel habits, abdominal pain, melena, hematochezia, jaundice, indigestion/heartburn, dysphagia, odynophagia, dysuria, hematuria, urinary frequency, urinary hesitancy, nocturia, incontinence, back pain, joint pain, joint swelling, muscle cramps, muscle weakness, stiffness, arthritis, sciatica, restless legs, leg pain at night, leg pain with exertion, rash, itching, dryness, suspicious lesions, paralysis, paresthesias, seizures, tremors, vertigo, transient blindness, frequent falls, frequent headaches, difficulty walking, depression, anxiety, memory loss, confusion, cold intolerance, heat intolerance, polydipsia, polyphagia, polyuria, unusual weight change, abnormal bruising, bleeding, enlarged lymph nodes, urticaria, allergic rash, hay fever, and recurrent infections.     Objective:   Physical Exam     WD, WN, 46 y/o WF in NAD... GENERAL:  Alert & oriented; pleasant & cooperative... HEENT:  [place]/AT, EOM-wnl, PERRLA, EACs-clear, TMs-wnl, NOSE-clear, THROAT-clear & wnl. NECK:  Supple w/ full ROM; no JVD; normal carotid impulses w/o bruits; no thyromegaly or nodules palpated; no lymphadenopathy. CHEST:  Clear to P & A; without wheezes/ rales/ or  rhonchi. HEART:  Regular Rhythm; without murmurs/ rubs/ or gallops. ABDOMEN:  Soft & nontender; normal bowel sounds; no organomegaly or masses detected. EXT: without deformities or arthritic changes; no varicose veins/ venous insuffic/ or edema. NEURO:  CN's intact; motor testing normal; sensory testing normal; gait normal & balance OK. DERM:  No lesions noted; no rash etc...  RADIOLOGY DATA:  Reviewed in the EPIC EMR & discussed w/ the patient...  LABORATORY DATA:  Reviewed in the EPIC EMR & discussed w/ the patient...   Assessment:     CPX>>  AR>  She uses OTC antihist as needed...  CHOL>  Stable on Simva20 & FLP looks good on this dose, tol well, etc...  GERD>  No recent symptoms and she ises OTC PPI vs H2blockers as needed...  IBS, Polyp, Hem>  She had GI eval & colonoscopy 2/13 by DrPyrtle; colon revealed one hyperplastic polyp; symptoms have subsided since them- w/o incr stool freq or recurrent blood...  Ortho>  No recurrent back pain or plantar fasciitis...     Plan:     Patient's Medications  New Prescriptions   SIMVASTATIN (ZOCOR) 20 MG TABLET    Take 1 tablet (20 mg total) by mouth every evening.  Previous Medications   No medications on file  Modified Medications   No medications on file  Discontinued Medications   SIMVASTATIN (ZOCOR) 10 MG TABLET    Take 1 tablet (10 mg total) by mouth at bedtime.

## 2012-12-25 ENCOUNTER — Other Ambulatory Visit: Payer: Self-pay | Admitting: Obstetrics and Gynecology

## 2012-12-25 DIAGNOSIS — Z1231 Encounter for screening mammogram for malignant neoplasm of breast: Secondary | ICD-10-CM

## 2013-02-08 ENCOUNTER — Ambulatory Visit
Admission: RE | Admit: 2013-02-08 | Discharge: 2013-02-08 | Disposition: A | Discharge status: Home / Self Care | Payer: 59 | Source: Ambulatory Visit | Attending: Obstetrics and Gynecology | Admitting: Obstetrics and Gynecology

## 2013-02-08 DIAGNOSIS — Z1231 Encounter for screening mammogram for malignant neoplasm of breast: Secondary | ICD-10-CM

## 2013-03-29 ENCOUNTER — Emergency Department (HOSPITAL_COMMUNITY)
Admission: EM | Admit: 2013-03-29 | Discharge: 2013-03-29 | Disposition: A | Discharge status: Home / Self Care | Payer: 59 | Attending: Emergency Medicine | Admitting: Emergency Medicine

## 2013-03-29 ENCOUNTER — Encounter (HOSPITAL_COMMUNITY): Payer: Self-pay | Admitting: Emergency Medicine

## 2013-03-29 DIAGNOSIS — Z8709 Personal history of other diseases of the respiratory system: Secondary | ICD-10-CM | POA: Insufficient documentation

## 2013-03-29 DIAGNOSIS — Z79899 Other long term (current) drug therapy: Secondary | ICD-10-CM | POA: Insufficient documentation

## 2013-03-29 DIAGNOSIS — Y92410 Unspecified street and highway as the place of occurrence of the external cause: Secondary | ICD-10-CM | POA: Insufficient documentation

## 2013-03-29 DIAGNOSIS — Z8719 Personal history of other diseases of the digestive system: Secondary | ICD-10-CM | POA: Insufficient documentation

## 2013-03-29 DIAGNOSIS — Y9389 Activity, other specified: Secondary | ICD-10-CM | POA: Insufficient documentation

## 2013-03-29 DIAGNOSIS — Z8739 Personal history of other diseases of the musculoskeletal system and connective tissue: Secondary | ICD-10-CM | POA: Insufficient documentation

## 2013-03-29 DIAGNOSIS — Z23 Encounter for immunization: Secondary | ICD-10-CM | POA: Insufficient documentation

## 2013-03-29 DIAGNOSIS — S0180XA Unspecified open wound of other part of head, initial encounter: Secondary | ICD-10-CM | POA: Insufficient documentation

## 2013-03-29 DIAGNOSIS — S0993XA Unspecified injury of face, initial encounter: Secondary | ICD-10-CM | POA: Insufficient documentation

## 2013-03-29 DIAGNOSIS — Z8659 Personal history of other mental and behavioral disorders: Secondary | ICD-10-CM | POA: Insufficient documentation

## 2013-03-29 DIAGNOSIS — E785 Hyperlipidemia, unspecified: Secondary | ICD-10-CM | POA: Insufficient documentation

## 2013-03-29 DIAGNOSIS — S0181XA Laceration without foreign body of other part of head, initial encounter: Secondary | ICD-10-CM

## 2013-03-29 MED ORDER — TETANUS-DIPHTH-ACELL PERTUSSIS 5-2.5-18.5 LF-MCG/0.5 IM SUSP
0.5000 mL | Freq: Once | INTRAMUSCULAR | Status: AC
  Administered 2013-03-29: 0.5 mL via INTRAMUSCULAR
  Filled 2013-03-29: qty 0.5
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
## 2013-03-29 NOTE — ED Provider Notes (Signed)
Medical screening examination/treatment/procedure(s) were performed by non-physician practitioner and as supervising physician I was immediately available for consultation/collaboration.   Terah Robey L Jarika Robben, MD 03/29/13 2056 
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
## 2013-04-09 ENCOUNTER — Telehealth: Payer: Self-pay | Admitting: Pulmonary Disease

## 2013-04-09 MED ORDER — SIMVASTATIN 20 MG PO TABS: 20.0000 mg | ORAL_TABLET | Freq: Every evening | ORAL | Status: DC

## 2013-04-09 NOTE — Telephone Encounter (Signed)
Pt states that her insurance company is no longer using [facility] for mail order. It has been changed to [facility]. She is needing her simvastatin prescription sent to them. Rx has been sent in. Nothing further was needed.

## 2013-08-02 ENCOUNTER — Telehealth: Payer: Self-pay | Admitting: Pulmonary Disease

## 2013-08-02 DIAGNOSIS — Z0000 Encounter for general adult medical examination without abnormal findings: Secondary | ICD-10-CM

## 2013-08-02 DIAGNOSIS — F411 Generalized anxiety disorder: Secondary | ICD-10-CM

## 2013-08-02 DIAGNOSIS — E7800 Pure hypercholesterolemia, unspecified: Secondary | ICD-10-CM

## 2013-08-02 DIAGNOSIS — K589 Irritable bowel syndrome without diarrhea: Secondary | ICD-10-CM

## 2013-08-02 MED ORDER — SIMVASTATIN 20 MG PO TABS: 20.0000 mg | ORAL_TABLET | Freq: Every evening | ORAL | Status: DC

## 2013-08-02 NOTE — Telephone Encounter (Signed)
LM with pt. She has not been seen in over a year. I sent in a 30 day supply. Asked her to call us back to schedule an appointment.

## 2013-08-02 NOTE — Telephone Encounter (Signed)
Per SN: pt will need FLP, BMET, CBC with Diff, TSH, f/u cxr, and ekg too.    ------  Orders placed for labs and cxr.  Pt aware to come for both and to be fasting.  She is aware EKG will be done during OV.  She verbalized understanding and voiced no further questions or concerns at this time.

## 2013-08-02 NOTE — Telephone Encounter (Signed)
Spoke with patient--  patien scheduled for complete physical Wed 8/20 w Dr. Kriste Basque Patient requesting to come in Monday 8/19 to have labs drawn Dr. Kriste Basque please advise on labs to be ordered if this is ok Thank You  Current Outpatient Prescriptions on File Prior to Visit  Medication Sig Dispense Refill  . glucosamine-chondroitin 500-400 MG tablet Take 1 tablet by mouth at bedtime.      . Multiple Vitamins-Minerals (MULTIVITAMIN WITH MINERALS) tablet Take 1 tablet by mouth daily.      . simvastatin (ZOCOR) 20 MG tablet Take 1 tablet (20 mg total) by mouth every evening.  30 tablet  0   No current facility-administered medications on file prior to visit.     Allergies  Allergen Reactions  . Codeine     REACTION: nausea

## 2013-08-05 ENCOUNTER — Other Ambulatory Visit (INDEPENDENT_AMBULATORY_CARE_PROVIDER_SITE_OTHER): Payer: 59

## 2013-08-05 ENCOUNTER — Ambulatory Visit (INDEPENDENT_AMBULATORY_CARE_PROVIDER_SITE_OTHER)
Admission: RE | Admit: 2013-08-05 | Discharge: 2013-08-05 | Disposition: A | Discharge status: Home / Self Care | Payer: 59 | Source: Ambulatory Visit | Attending: Pulmonary Disease | Admitting: Pulmonary Disease

## 2013-08-05 DIAGNOSIS — F411 Generalized anxiety disorder: Secondary | ICD-10-CM

## 2013-08-05 DIAGNOSIS — E7800 Pure hypercholesterolemia, unspecified: Secondary | ICD-10-CM

## 2013-08-05 DIAGNOSIS — Z0000 Encounter for general adult medical examination without abnormal findings: Secondary | ICD-10-CM

## 2013-08-05 DIAGNOSIS — K589 Irritable bowel syndrome without diarrhea: Secondary | ICD-10-CM

## 2013-08-05 LAB — CBC WITH DIFFERENTIAL/PLATELET
Basophils Relative: 0.8 % (ref 0.0–3.0)
Eosinophils Absolute: 0.1 10*3/uL (ref 0.0–0.7)
Eosinophils Relative: 2.4 % (ref 0.0–5.0)
HCT: 35.5 % — ABNORMAL LOW (ref 36.0–46.0)
Hemoglobin: 12.2 g/dL (ref 12.0–15.0)
MCHC: 34.5 g/dL (ref 30.0–36.0)
MCV: 92 fl (ref 78.0–100.0)
Monocytes Absolute: 0.5 10*3/uL (ref 0.1–1.0)
Neutro Abs: 2.7 10*3/uL (ref 1.4–7.7)
RBC: 3.86 Mil/uL — ABNORMAL LOW (ref 3.87–5.11)

## 2013-08-05 LAB — BASIC METABOLIC PANEL
Calcium: 9 mg/dL (ref 8.4–10.5)
Chloride: 105 mEq/L (ref 96–112)
Creatinine, Ser: 0.7 mg/dL (ref 0.4–1.2)
GFR: 99.44 mL/min (ref >60.00)

## 2013-08-05 LAB — HEPATIC FUNCTION PANEL
ALT: 21 U/L (ref 0–35)
Bilirubin, Direct: 0.1 mg/dL (ref 0.0–0.3)
Total Bilirubin: 0.7 mg/dL (ref 0.3–1.2)

## 2013-08-05 LAB — TSH: TSH: 2.49 u[IU]/mL (ref 0.35–5.50)

## 2013-08-05 LAB — LIPID PANEL
LDL Cholesterol: 90 mg/dL (ref 0–99)
Total CHOL/HDL Ratio: 2
Triglycerides: 51 mg/dL (ref 0.0–149.0)
VLDL: 10.2 mg/dL (ref 0.0–40.0)

## 2013-08-07 ENCOUNTER — Ambulatory Visit (INDEPENDENT_AMBULATORY_CARE_PROVIDER_SITE_OTHER): Payer: 59 | Admitting: Pulmonary Disease

## 2013-08-07 ENCOUNTER — Encounter: Payer: Self-pay | Admitting: Pulmonary Disease

## 2013-08-07 VITALS — BP 118/62 | HR 62 | Temp 98.1°F | Ht 66.5 in | Wt 143.8 lb

## 2013-08-07 DIAGNOSIS — M545 Low back pain, unspecified: Secondary | ICD-10-CM

## 2013-08-07 DIAGNOSIS — Z860100 Personal history of colon polyps, unspecified: Secondary | ICD-10-CM

## 2013-08-07 DIAGNOSIS — J309 Allergic rhinitis, unspecified: Secondary | ICD-10-CM

## 2013-08-07 DIAGNOSIS — K219 Gastro-esophageal reflux disease without esophagitis: Secondary | ICD-10-CM

## 2013-08-07 DIAGNOSIS — E7800 Pure hypercholesterolemia, unspecified: Secondary | ICD-10-CM

## 2013-08-07 DIAGNOSIS — K589 Irritable bowel syndrome without diarrhea: Secondary | ICD-10-CM

## 2013-08-07 DIAGNOSIS — Z0000 Encounter for general adult medical examination without abnormal findings: Secondary | ICD-10-CM

## 2013-08-07 MED ORDER — SIMVASTATIN 20 MG PO TABS: 20.0000 mg | ORAL_TABLET | Freq: Every evening | ORAL | Status: DC

## 2013-08-07 NOTE — Progress Notes (Signed)
Subjective:     Patient ID: Kylie Miller, female   DOB: Jul 07, 1966, 47 y.o.   MRN: [id number]  HPI 47 y/o WF here for a follow up visit and CPX...   ~  [name]:  she was last seen [name] doing well... we decided to start Lipitor 10mg /d for her Chol with a great response, but she has since discontinued this med in favor of diet alone... we did f/u FLP w/ TChol 227, LDL 141- started Simva20...  ~  October 09, 2009:  she's had a good 59mo, tolerating Simva20 & working well... had plantar fasciitis w/ eval by [facility]- stretching, PT, NSAIDs Prn... no new complaints or concerns...  ~  February 11, 2011:  here for CPX & 59mo ROV doing well w/o new complaints or concerns... she does c/o some incr gas & we discussed antigas rx w/ simethacone, etc... she continues to do well on the Simva20...  ~  June 13, 2012:  59mo ROV & CPX> [name] has had a good yr- feeling well & no new complaints or concerns...    CHOL> on Simva20 & f/u FLP looks good, all parameters at goals (see below); continue same...    GI> GERD, IBS, Polyp> still notes some gas problems but improved on Simethacone preps; she saw DrPyrtle for Colonoscopy 3/13 w/ one 7mm hyperplastic polyp removed, sm int hems seen, f/u planned 10 yrs... We reviewed prob list, meds, xrays and labs> see below>> LABS 6/13:  FLP- allparameters at goals on Simva20;  Chems- wnl;  CBC- wnl;  TSH=2.37;  UA- clear   ~  August 07, 2013:  68mo ROV & CPX> [name] reports a good year- no new complaints or concerns...     AR> on Zyrtek OTC as needed...    CHOL> on Simva20 & f/u FLP looks good, all parameters at goals (see below); continue same...    GI> GERD, IBS, Polyp> still notes some gas problems but improved on Simethacone preps; she saw DrPyrtle for Colonoscopy 3/13 w/ one 7mm hyperplastic polyp removed, sm int hems seen, f/u planned 10 yrs... We reviewed prob list, meds, xrays and labs> see below for updates >>  CXR 8/14 showed norm heart size, clear lungs,  NAD... LABS 8/14:  FLP- at goals on Simva20;  Chems- wnl;  LFTs= wnl;  CBC- ok w/ Hg=12.2 MCV=92;  TSH=2.49...           Problem List:    PHYSICAL EXAMINATION (ICD-V70.0) - GYN = DrTomblin for Pap, Mammogram, etc... she is up to date w/ her check ups... ~  CXR 10/10 showed normal heart size, clear lungs, NAD... ~  EKG 2/12 showed NSR, rate67, WNL.Marland Kitchen. ~  CXR 8/14 showed norm heart size, clear lungs, NAD...  ALLERGIC RHINITIS (ICD-477.9) - OTC Claritin/ Zyrtek as needed.  HYPERCHOLESTEROLEMIA (ICD-272.0) - on SIMVASTATIN 20mg /d + low chol diet... ~  prev FLP's w/ TChol 230-240, TG 60's, HDL 68-70, LDL 160 range. ~  FLP on statin Rx showed TChol 150-170, TG 3-60, HDL 61-67, LDL 83-92 ~  FLP 4/09 showed TChol 227, TG 58, HDL 65, LDL 141... rec> start SIMVA20. ~  FLP 10/09 on Simva20 showed TChol 156, TG 49, HDL 66, LDL 80 ~  FLP 10/10 on Simva20 showed TChol 161, TG 67, HDL 59, LDL 89 ~  FLP 2/12 on Simva20 showed TChol 198, TG 102, HDL 70, LDL 107... we reviewed diet/ exercise. ~  FLP 6/13 on Simva20 showed TChol 179, TG 66, HDL 76, LDL 90 ~  FLP 8/14 on Simva20 showed TChol 174, TG 51, HDL 74, LDL 90  GERD >> no recent problems... prev used Prilosec vs Ranitadine...  IRRITABLE BOWEL SYNDROME COLON POLYP >> 7mm dec colon polyp removed 2/13= hyperplastic. INTERNAL HEMORRHOIDS ~  She has had complaints of gas & treated w/ OTC Simethacone preps... ~  2/13:  GI eval DrPyrtle for change in bowel habits w/ freq & intermittent blood seen> neg exam, normal labs including Tissue Transglutaminase & IgA level (neg for Celiac dis)... ~  Colonoscopy 2/13 by DrPyrtle 2/13 showed one 7mm polyp in desc colon- hyperplastic, sm int hems... F/u planned 10 yrs.  Family Hx of Pancreatic Cancer >>  ~  the patient's mother had a history of pancreatic cancer and she discussed this w/ DrPyrtle 2/13;  They reviewed how there are no guidelines currently supporting screening in asymptomatic individuals with a  family history of pancreatic cancer (this being based on the fact that there is no known family history of hereditary pancreatitis or known hereditary/genetic cancer syndromes)...  BACK PAIN, LUMBAR (ICD-724.2) - hx lumbar disc dis, no surgery... developed plantar fasciitis 2010 w/ eval by [facility]- stretching, PT, NSAIDs Prn...  ANXIETY (ICD-300.00)   Past Surgical History  Procedure Laterality Date  . Vaginal hysterectomy    . Cesarean section      Outpatient Encounter Prescriptions as of 08/07/2013  Medication Sig Dispense Refill  . glucosamine-chondroitin 500-400 MG tablet Take 1 tablet by mouth at bedtime.      . Multiple Vitamins-Minerals (MULTIVITAMIN WITH MINERALS) tablet Take 1 tablet by mouth daily.      . simvastatin (ZOCOR) 20 MG tablet Take 1 tablet (20 mg total) by mouth every evening.  30 tablet  0   No facility-administered encounter medications on file as of 08/07/2013.    Allergies  Allergen Reactions  . Codeine     REACTION: nausea    Current Medications, Allergies, Past Medical History, Past Surgical History, Family History, and Social History were reviewed in [employer] record.   Review of Systems        The patient complains of gas/bloating.  The patient denies fever, chills, sweats, anorexia, fatigue, weakness, malaise, weight loss, sleep disorder, blurring, diplopia, eye irritation, eye discharge, vision loss, eye pain, photophobia, earache, ear discharge, tinnitus, decreased hearing, nasal congestion, nosebleeds, sore throat, hoarseness, chest pain, palpitations, syncope, dyspnea on exertion, orthopnea, PND, peripheral edema, cough, dyspnea at rest, excessive sputum, hemoptysis, wheezing, pleurisy, nausea, vomiting, diarrhea, constipation, change in bowel habits, abdominal pain, melena, hematochezia, jaundice, indigestion/heartburn, dysphagia, odynophagia, dysuria, hematuria, urinary frequency, urinary hesitancy, nocturia,  incontinence, back pain, joint pain, joint swelling, muscle cramps, muscle weakness, stiffness, arthritis, sciatica, restless legs, leg pain at night, leg pain with exertion, rash, itching, dryness, suspicious lesions, paralysis, paresthesias, seizures, tremors, vertigo, transient blindness, frequent falls, frequent headaches, difficulty walking, depression, anxiety, memory loss, confusion, cold intolerance, heat intolerance, polydipsia, polyphagia, polyuria, unusual weight change, abnormal bruising, bleeding, enlarged lymph nodes, urticaria, allergic rash, hay fever, and recurrent infections.     Objective:   Physical Exam     WD, WN, 47 y/o WF in NAD... GENERAL:  Alert & oriented; pleasant & cooperative... HEENT:  [place]/AT, EOM-wnl, PERRLA, EACs-clear, TMs-wnl, NOSE-clear, THROAT-clear & wnl. NECK:  Supple w/ full ROM; no JVD; normal carotid impulses w/o bruits; no thyromegaly or nodules palpated; no lymphadenopathy. CHEST:  Clear to P & A; without wheezes/ rales/ or rhonchi. HEART:  Regular Rhythm; without murmurs/ rubs/ or gallops. ABDOMEN:  Soft & nontender; normal bowel sounds; no organomegaly or masses detected. EXT: without deformities or arthritic changes; no varicose veins/ venous insuffic/ or edema. NEURO:  CN's intact; motor testing normal; sensory testing normal; gait normal & balance OK. DERM:  No lesions noted; no rash etc...  RADIOLOGY DATA:  Reviewed in the EPIC EMR & discussed w/ the patient...  LABORATORY DATA:  Reviewed in the EPIC EMR & discussed w/ the patient...   Assessment:     CPX>>  AR>  She uses OTC antihist as needed...  CHOL>  Stable on Simva20 & FLP looks good on this dose, tol well, etc...  GERD>  No recent symptoms and she ises OTC PPI vs H2blockers as needed...  IBS, Polyp, Hem>  She had GI eval & colonoscopy 2/13 by DrPyrtle; colon revealed one hyperplastic polyp; symptoms have subsided since them- w/o incr stool freq or recurrent blood...  Ortho>  No  recurrent back pain or plantar fasciitis...     Plan:     Patient's Medications  New Prescriptions   No medications on file  Previous Medications   GLUCOSAMINE-CHONDROITIN 500-400 MG TABLET    Take 1 tablet by mouth at bedtime.   MULTIPLE VITAMINS-MINERALS (MULTIVITAMIN WITH MINERALS) TABLET    Take 1 tablet by mouth daily.  Modified Medications   Modified Medication Previous Medication   SIMVASTATIN (ZOCOR) 20 MG TABLET simvastatin (ZOCOR) 20 MG tablet      Take 1 tablet (20 mg total) by mouth every evening.    Take 1 tablet (20 mg total) by mouth every evening.  Discontinued Medications   No medications on file

## 2013-08-07 NOTE — Patient Instructions (Addendum)
Today we updated your med list in our EPIC system...    Continue your current medications the same...  We reviewed your recent fasting labs and gave you a copy for your records...  Call for any questions...  Let's plan a follow up visit in 41yr, sooner if needed for problems.Marland KitchenMarland Kitchen

## 2014-05-08 ENCOUNTER — Other Ambulatory Visit: Payer: Self-pay | Admitting: Obstetrics and Gynecology

## 2014-05-08 DIAGNOSIS — N63 Unspecified lump in unspecified breast: Secondary | ICD-10-CM

## 2014-05-20 ENCOUNTER — Ambulatory Visit
Admission: RE | Admit: 2014-05-20 | Discharge: 2014-05-20 | Disposition: A | Discharge status: Home / Self Care | Payer: 59 | Source: Ambulatory Visit | Attending: Obstetrics and Gynecology | Admitting: Obstetrics and Gynecology

## 2014-05-20 DIAGNOSIS — N63 Unspecified lump in unspecified breast: Secondary | ICD-10-CM

## 2014-07-01 ENCOUNTER — Other Ambulatory Visit: Payer: Self-pay | Admitting: Pulmonary Disease

## 2014-08-20 ENCOUNTER — Telehealth: Payer: Self-pay | Admitting: Pulmonary Disease

## 2014-08-20 NOTE — Telephone Encounter (Signed)
Called and spoke with pt and she is aware of appt with SN on 9-18 for cpx.  Pt told to come in fasting for her appt.

## 2014-09-05 ENCOUNTER — Ambulatory Visit (INDEPENDENT_AMBULATORY_CARE_PROVIDER_SITE_OTHER): Payer: 59 | Admitting: Pulmonary Disease

## 2014-09-05 ENCOUNTER — Encounter (INDEPENDENT_AMBULATORY_CARE_PROVIDER_SITE_OTHER): Payer: Self-pay

## 2014-09-05 ENCOUNTER — Other Ambulatory Visit (INDEPENDENT_AMBULATORY_CARE_PROVIDER_SITE_OTHER): Payer: 59

## 2014-09-05 ENCOUNTER — Encounter: Payer: Self-pay | Admitting: Pulmonary Disease

## 2014-09-05 VITALS — BP 100/60 | HR 69 | Temp 98.5°F | Ht 66.5 in | Wt 146.0 lb

## 2014-09-05 DIAGNOSIS — Z86010 Personal history of colonic polyps: Secondary | ICD-10-CM

## 2014-09-05 DIAGNOSIS — E7800 Pure hypercholesterolemia, unspecified: Secondary | ICD-10-CM

## 2014-09-05 DIAGNOSIS — Z23 Encounter for immunization: Secondary | ICD-10-CM

## 2014-09-05 DIAGNOSIS — M545 Low back pain, unspecified: Secondary | ICD-10-CM

## 2014-09-05 DIAGNOSIS — Z0000 Encounter for general adult medical examination without abnormal findings: Secondary | ICD-10-CM | POA: Insufficient documentation

## 2014-09-05 DIAGNOSIS — K219 Gastro-esophageal reflux disease without esophagitis: Secondary | ICD-10-CM

## 2014-09-05 DIAGNOSIS — K589 Irritable bowel syndrome without diarrhea: Secondary | ICD-10-CM

## 2014-09-05 DIAGNOSIS — J309 Allergic rhinitis, unspecified: Secondary | ICD-10-CM

## 2014-09-05 LAB — TSH: TSH: 3.46 u[IU]/mL (ref 0.35–4.50)

## 2014-09-05 LAB — CBC WITH DIFFERENTIAL/PLATELET
BASOS PCT: 0.8 % (ref 0.0–3.0)
Basophils Absolute: 0 10*3/uL (ref 0.0–0.1)
EOS ABS: 0.2 10*3/uL (ref 0.0–0.7)
EOS PCT: 3.6 % (ref 0.0–5.0)
HCT: 38.3 % (ref 36.0–46.0)
HEMOGLOBIN: 12.7 g/dL (ref 12.0–15.0)
LYMPHS PCT: 28.8 % (ref 12.0–46.0)
Lymphs Abs: 1.4 10*3/uL (ref 0.7–4.0)
MCHC: 33.2 g/dL (ref 30.0–36.0)
MCV: 94.9 fl (ref 78.0–100.0)
Monocytes Absolute: 0.5 10*3/uL (ref 0.1–1.0)
Monocytes Relative: 10 % (ref 3.0–12.0)
NEUTROS ABS: 2.8 10*3/uL (ref 1.4–7.7)
Neutrophils Relative %: 56.8 % (ref 43.0–77.0)
Platelets: 280 10*3/uL (ref 150.0–400.0)
RBC: 4.03 Mil/uL (ref 3.87–5.11)
RDW: 13.2 % (ref 11.5–15.5)
WBC: 4.9 10*3/uL (ref 4.0–10.5)

## 2014-09-05 LAB — HEPATIC FUNCTION PANEL
ALT: 21 U/L (ref 0–35)
AST: 24 U/L (ref 0–37)
Albumin: 4.1 g/dL (ref 3.5–5.2)
Alkaline Phosphatase: 43 U/L (ref 39–117)
BILIRUBIN DIRECT: 0.1 mg/dL (ref 0.0–0.3)
TOTAL PROTEIN: 7.7 g/dL (ref 6.0–8.3)
Total Bilirubin: 0.5 mg/dL (ref 0.2–1.2)

## 2014-09-05 LAB — BASIC METABOLIC PANEL
BUN: 15 mg/dL (ref 6–23)
CALCIUM: 9.3 mg/dL (ref 8.4–10.5)
CO2: 26 meq/L (ref 19–32)
Chloride: 104 mEq/L (ref 96–112)
Creatinine, Ser: 0.8 mg/dL (ref 0.4–1.2)
GFR: 83.07 mL/min (ref >60.00)
Glucose, Bld: 97 mg/dL (ref 70–99)
Potassium: 4.3 mEq/L (ref 3.5–5.1)
Sodium: 138 mEq/L (ref 135–145)

## 2014-09-05 LAB — LIPID PANEL
Cholesterol: 191 mg/dL (ref 0–200)
HDL: 72.5 mg/dL (ref >39.00)
LDL Cholesterol: 111 mg/dL — ABNORMAL HIGH (ref 0–99)
NONHDL: 118.5
Total CHOL/HDL Ratio: 3
Triglycerides: 36 mg/dL (ref 0.0–149.0)
VLDL: 7.2 mg/dL (ref 0.0–40.0)

## 2014-09-05 NOTE — Progress Notes (Signed)
Subjective:     Patient ID: Kylie Miller, female   DOB: 01-30-1966, 48 y.o.   MRN: [id number]  HPI 48 y/o WF here for a follow up visit and CPX...   ~  [name]:  she was last seen [name] doing well... we decided to start Lipitor 10mg /d for her Chol with a great response, but she has since discontinued this med in favor of diet alone... we did f/u FLP w/ TChol 227, LDL 141- started Simva20... ~  October 09, 2009:  she's had a good 24mo, tolerating Simva20 & working well... had plantar fasciitis w/ eval by [facility]- stretching, PT, NSAIDs Prn... no new complaints or concerns... ~  February 11, 2011:  here for CPX & 34mo ROV doing well w/o new complaints or concerns... she does c/o some incr gas & we discussed antigas rx w/ simethacone, etc... she continues to do well on the Simva20...  ~  June 13, 2012:  77mo ROV & CPX> Latrena has had a good yr- feeling well & no new complaints or concerns...    CHOL> on Simva20 & f/u FLP looks good, all parameters at goals (see below); continue same...    GI> GERD, IBS, Polyp> still notes some gas problems but improved on Simethacone preps; she saw DrPyrtle for Colonoscopy 3/13 w/ one 20mm hyperplastic polyp removed, sm int hems seen, f/u planned 10 yrs... We reviewed prob list, meds, xrays and labs> see below>>  LABS 6/13:  FLP- allparameters at goals on Simva20;  Chems- wnl;  CBC- wnl;  TSH=2.37;  UA- clear   ~  August 07, 2013:  92mo ROV & CPX> Seri reports a good year- no new complaints or concerns...     AR> on Zyrtek OTC as needed...    CHOL> on Simva20 & f/u FLP looks good, all parameters at goals (see below); continue same...    GI> GERD, IBS, Polyp> still notes some gas problems but improved on Simethacone preps; she saw DrPyrtle for Colonoscopy 3/13 w/ one 24mm hyperplastic polyp removed, sm int hems seen, f/u planned 10 yrs... We reviewed prob list, meds, xrays and labs> see below for updates >>   CXR 8/14 showed norm heart size, clear lungs,  NAD...  LABS 8/14:  FLP- at goals on Simva20;  Chems- wnl;  LFTs= wnl;  CBC- ok w/ Hg=12.2 MCV=92;  TSH=2.49...   ~  September 05, 2014:  Yearly ROV & CPX>  [name] reports a good yr- no new complaints or concerns, she & husb have started running & they competed in a 1/2Marathon... We reviewed the following medical problems during today's office visit >>     AR> on Zyrtek OTC as needed...    CHOL> on Simva20 & f/u FLP looks good, all parameters at goals (see below); continue same...    GI> GERD, IBS, Polyp> still notes some gas problems but improved on Simethacone preps prn; she saw DrPyrtle for Colonoscopy 3/13 w/ one 66mm hyperplastic polyp removed, sm int hems seen, f/u planned 10 yrs... We reviewed prob list, meds, xrays and labs> see below for updates >> OK 2015 Flu shot today  LABS 9/15:  FLP- ok x LDL=111;  Chems- wnl;  CBC- wnl;  TSH=3.46...          Problem List:    PHYSICAL EXAMINATION (ICD-V70.0) - GYN = DrTomblin for Pap, Mammogram, etc... she is up to date w/ her check ups... ~  CXR 10/10 showed normal heart size, clear lungs, NAD... ~  EKG 2/12  showed NSR, rate67, WNL.Marland Kitchen. ~  CXR 8/14 showed norm heart size, clear lungs, NAD...  ALLERGIC RHINITIS (ICD-477.9) - OTC Claritin/ Zyrtek as needed.  HYPERCHOLESTEROLEMIA (ICD-272.0) - on SIMVASTATIN 20mg /d + low chol diet... ~  prev FLP's w/ TChol 230-240, TG 60's, HDL 68-70, LDL 160 range. ~  FLP on statin Rx showed TChol 150-170, TG 3-60, HDL 61-67, LDL 83-92 ~  FLP 4/09 showed TChol 227, TG 58, HDL 65, LDL 141... rec> start SIMVA20. ~  [place] 10/09 on Simva20 showed TChol 156, TG 49, HDL 66, LDL 80 ~  FLP 10/10 on Simva20 showed TChol 161, TG 67, HDL 59, LDL 89 ~  FLP 2/12 on Simva20 showed TChol 198, TG 102, HDL 70, LDL 107... we reviewed diet/ exercise. ~  FLP 6/13 on Simva20 showed TChol 179, TG 66, HDL 76, LDL 90 ~  FLP 8/14 on Simva20 showed TChol 174, TG 51, HDL 74, LDL 90 ~  FLP 9/15 on Simva20 showed TChol 191, TG 36, HDL  73, LDL 111... We reviewed diet & exercise.  GERD >> no recent problems... prev used Prilosec vs Ranitadine...  IRRITABLE BOWEL SYNDROME COLON POLYP >> 21mm dec colon polyp removed 2/13= hyperplastic. INTERNAL HEMORRHOIDS ~  She has had complaints of gas & treated w/ OTC Simethacone preps... ~  2/13:  GI eval DrPyrtle for change in bowel habits w/ freq & intermittent blood seen> neg exam, normal labs including Tissue Transglutaminase & IgA level (neg for Celiac dis)... ~  Colonoscopy 2/13 by DrPyrtle 2/13 showed one 14mm polyp in desc colon- hyperplastic, sm int hems... F/u planned 10 yrs.  Family Hx of Pancreatic Cancer >>  ~  the patient's mother had a history of pancreatic cancer and she discussed this w/ DrPyrtle 2/13;  They reviewed how there are no guidelines currently supporting screening in asymptomatic individuals with a family history of pancreatic cancer (this being based on the fact that there is no known family history of hereditary pancreatitis or known hereditary/genetic cancer syndromes)...  BACK PAIN, LUMBAR (ICD-724.2) - hx lumbar disc dis, no surgery... developed plantar fasciitis 2010 w/ eval by [facility]- stretching, PT, NSAIDs Prn...  ANXIETY (ICD-300.00)   Past Surgical History  Procedure Laterality Date  . Vaginal hysterectomy    . Cesarean section      Outpatient Encounter Prescriptions as of 09/05/2014  Medication Sig  . glucosamine-chondroitin 500-400 MG tablet Take 1 tablet by mouth at bedtime.  . Multiple Vitamins-Minerals (MULTIVITAMIN WITH MINERALS) tablet Take 1 tablet by mouth daily.  . simvastatin (ZOCOR) 20 MG tablet Take 1 tablet by mouth  every evening    Allergies  Allergen Reactions  . Codeine     REACTION: nausea    Current Medications, Allergies, Past Medical History, Past Surgical History, Family History, and Social History were reviewed in [employer] record.   Review of Systems        The patient complains of  gas/bloating.  The patient denies fever, chills, sweats, anorexia, fatigue, weakness, malaise, weight loss, sleep disorder, blurring, diplopia, eye irritation, eye discharge, vision loss, eye pain, photophobia, earache, ear discharge, tinnitus, decreased hearing, nasal congestion, nosebleeds, sore throat, hoarseness, chest pain, palpitations, syncope, dyspnea on exertion, orthopnea, PND, peripheral edema, cough, dyspnea at rest, excessive sputum, hemoptysis, wheezing, pleurisy, nausea, vomiting, diarrhea, constipation, change in bowel habits, abdominal pain, melena, hematochezia, jaundice, indigestion/heartburn, dysphagia, odynophagia, dysuria, hematuria, urinary frequency, urinary hesitancy, nocturia, incontinence, back pain, joint pain, joint swelling, muscle cramps, muscle weakness, stiffness, arthritis, sciatica,  restless legs, leg pain at night, leg pain with exertion, rash, itching, dryness, suspicious lesions, paralysis, paresthesias, seizures, tremors, vertigo, transient blindness, frequent falls, frequent headaches, difficulty walking, depression, anxiety, memory loss, confusion, cold intolerance, heat intolerance, polydipsia, polyphagia, polyuria, unusual weight change, abnormal bruising, bleeding, enlarged lymph nodes, urticaria, allergic rash, hay fever, and recurrent infections.     Objective:   Physical Exam     WD, WN, 48 y/o WF in NAD... GENERAL:  Alert & oriented; pleasant & cooperative... HEENT:  /AT, EOM-wnl, PERRLA, EACs-clear, TMs-wnl, NOSE-clear, THROAT-clear & wnl. NECK:  Supple w/ full ROM; no JVD; normal carotid impulses w/o bruits; no thyromegaly or nodules palpated; no lymphadenopathy. CHEST:  Clear to P & A; without wheezes/ rales/ or rhonchi. HEART:  Regular Rhythm; without murmurs/ rubs/ or gallops. ABDOMEN:  Soft & nontender; normal bowel sounds; no organomegaly or masses detected. EXT: without deformities or arthritic changes; no varicose veins/ venous insuffic/ or  edema. NEURO:  CN's intact; motor testing normal; sensory testing normal; gait normal & balance OK. DERM:  No lesions noted; no rash etc...  RADIOLOGY DATA:  Reviewed in the EPIC EMR & discussed w/ the patient...  LABORATORY DATA:  Reviewed in the EPIC EMR & discussed w/ the patient...   Assessment:     CPX>>  AR>  She uses OTC antihist as needed...  CHOL>  Stable on Simva20 & FLP looks good on this dose, tol well, etc...  GERD>  No recent symptoms and she ises OTC PPI vs H2blockers as needed...  IBS, Polyp, Hem>  She had GI eval & colonoscopy 2/13 by DrPyrtle; colon revealed one hyperplastic polyp; symptoms have subsided since them- w/o incr stool freq or recurrent blood...  Ortho>  No recurrent back pain or plantar fasciitis...     Plan:     Patient's Medications  New Prescriptions   No medications on file  Previous Medications   GLUCOSAMINE-CHONDROITIN 500-400 MG TABLET    Take 1 tablet by mouth at bedtime.   MULTIPLE VITAMINS-MINERALS (MULTIVITAMIN WITH MINERALS) TABLET    Take 1 tablet by mouth daily.   SIMVASTATIN (ZOCOR) 20 MG TABLET    Take 1 tablet by mouth  every evening  Modified Medications   No medications on file  Discontinued Medications   No medications on file

## 2014-09-05 NOTE — Patient Instructions (Signed)
Today we updated your med list in our EPIC system...    Continue your current medications the same...  Today we did your follow up FASTING blood work...    We will contact you w/ the results when available...   We gave you the 2015 flu vaccine...  Call for any questions...  Let's plan a follow up visit in 87yr, sooner if needed for problems.Marland KitchenMarland Kitchen

## 2014-09-10 ENCOUNTER — Telehealth: Payer: Self-pay | Admitting: Pulmonary Disease

## 2014-09-10 NOTE — Telephone Encounter (Signed)
Result Note     Please notify patient>     FLP is ok but LDL=111 w/ goal<100; Rec diet/ exercise...    Chems, CBC, Thyroid all wnl...    I spoke with patient about results and she verbalized understanding. Pt reports she left mail order paperwork at last OV for her simvastatin. She wants to make sure this has been taken care of. Please advise leigh thanks

## 2014-09-10 NOTE — Telephone Encounter (Signed)
Pt returned call - advised Marliss Czar has already faxed the forms.  Pt will call if she does not receive her medication.  Nothing further needed at this time, will sign off.

## 2014-09-10 NOTE — Telephone Encounter (Signed)
Called pt LMTCB x1 

## 2014-09-10 NOTE — Telephone Encounter (Signed)
These forms were completed and signed by SN and faxed in for her to get her medication.  thanks

## 2015-04-01 ENCOUNTER — Other Ambulatory Visit: Payer: Self-pay | Admitting: Obstetrics and Gynecology

## 2015-04-02 LAB — CYTOLOGY - PAP

## 2015-08-28 ENCOUNTER — Other Ambulatory Visit: Payer: Self-pay | Admitting: Pulmonary Disease

## 2015-09-01 ENCOUNTER — Telehealth: Payer: Self-pay | Admitting: Pulmonary Disease

## 2015-09-01 DIAGNOSIS — E7800 Pure hypercholesterolemia, unspecified: Secondary | ICD-10-CM

## 2015-09-01 NOTE — Telephone Encounter (Signed)
Patient called back and wants to know if she can get labs drawn prior to her appointment on 09/08/15.  Dr. Lenna Gilford, please advise if okay and what labs?

## 2015-09-01 NOTE — Telephone Encounter (Signed)
lmtcb

## 2015-09-01 NOTE — Telephone Encounter (Signed)
Patient returned call.  She said if want to set up labs and call and leave her a msg when to come that will be fine.  [phone].

## 2015-09-02 NOTE — Telephone Encounter (Signed)
Labs ordered and pt made aware  She as reminded to come in fasting

## 2015-09-02 NOTE — Telephone Encounter (Signed)
Per SN, Please order FLP, BMET, Hepatic Panel, CBC with diff, TSH, and Vit D Please notify pt of labs

## 2015-09-03 ENCOUNTER — Other Ambulatory Visit (INDEPENDENT_AMBULATORY_CARE_PROVIDER_SITE_OTHER): Payer: 59

## 2015-09-03 DIAGNOSIS — E7800 Pure hypercholesterolemia, unspecified: Secondary | ICD-10-CM

## 2015-09-03 LAB — CBC WITH DIFFERENTIAL/PLATELET
Basophils Absolute: 0 10*3/uL (ref 0.0–0.1)
Basophils Relative: 0.8 % (ref 0.0–3.0)
EOS PCT: 5.1 % — AB (ref 0.0–5.0)
Eosinophils Absolute: 0.3 10*3/uL (ref 0.0–0.7)
HEMATOCRIT: 36.4 % (ref 36.0–46.0)
Hemoglobin: 12.4 g/dL (ref 12.0–15.0)
Lymphocytes Relative: 25.5 % (ref 12.0–46.0)
Lymphs Abs: 1.4 10*3/uL (ref 0.7–4.0)
MCHC: 34.1 g/dL (ref 30.0–36.0)
MCV: 92.7 fl (ref 78.0–100.0)
MONOS PCT: 10.8 % (ref 3.0–12.0)
Monocytes Absolute: 0.6 10*3/uL (ref 0.1–1.0)
NEUTROS ABS: 3.1 10*3/uL (ref 1.4–7.7)
NEUTROS PCT: 57.8 % (ref 43.0–77.0)
PLATELETS: 251 10*3/uL (ref 150.0–400.0)
RBC: 3.93 Mil/uL (ref 3.87–5.11)
RDW: 12.9 % (ref 11.5–15.5)
WBC: 5.3 10*3/uL (ref 4.0–10.5)

## 2015-09-03 LAB — BASIC METABOLIC PANEL
BUN: 14 mg/dL (ref 6–23)
CHLORIDE: 103 meq/L (ref 96–112)
CO2: 26 meq/L (ref 19–32)
Calcium: 9 mg/dL (ref 8.4–10.5)
Creatinine, Ser: 0.71 mg/dL (ref 0.40–1.20)
GFR: 92.22 mL/min (ref >60.00)
GLUCOSE: 83 mg/dL (ref 70–99)
Potassium: 4.1 mEq/L (ref 3.5–5.1)
Sodium: 137 mEq/L (ref 135–145)

## 2015-09-03 LAB — LIPID PANEL
CHOL/HDL RATIO: 2
CHOLESTEROL: 169 mg/dL (ref 0–200)
HDL: 72.2 mg/dL (ref >39.00)
LDL Cholesterol: 83 mg/dL (ref 0–99)
NonHDL: 96.56
TRIGLYCERIDES: 68 mg/dL (ref 0.0–149.0)
VLDL: 13.6 mg/dL (ref 0.0–40.0)

## 2015-09-03 LAB — TSH: TSH: 3.92 u[IU]/mL (ref 0.35–4.50)

## 2015-09-03 LAB — VITAMIN D 25 HYDROXY (VIT D DEFICIENCY, FRACTURES): VITD: 41.3 ng/mL (ref 30.00–100.00)

## 2015-09-08 ENCOUNTER — Encounter: Payer: Self-pay | Admitting: Pulmonary Disease

## 2015-09-08 ENCOUNTER — Ambulatory Visit (INDEPENDENT_AMBULATORY_CARE_PROVIDER_SITE_OTHER): Payer: 59 | Admitting: Pulmonary Disease

## 2015-09-08 VITALS — BP 102/68 | HR 73 | Temp 98.3°F | Wt 148.8 lb

## 2015-09-08 DIAGNOSIS — Z23 Encounter for immunization: Secondary | ICD-10-CM | POA: Diagnosis not present

## 2015-09-08 DIAGNOSIS — Z0000 Encounter for general adult medical examination without abnormal findings: Secondary | ICD-10-CM

## 2015-09-08 MED ORDER — ALPRAZOLAM 0.5 MG PO TABS
0.5000 mg | ORAL_TABLET | Freq: Four times a day (QID) | ORAL | Status: DC | PRN
Start: 2015-09-08 — End: 2018-02-13

## 2015-09-08 NOTE — Progress Notes (Signed)
Subjective:     Patient ID: Kylie Miller, female   DOB: [birth year], 49 y.o.   MRN: [id number]  HPI 49 y/o WF here for a follow up visit and CPX...   ~  [name]:  she was last seen [name] doing well... we decided to start Lipitor 10mg /d for her Chol with a great response, but she has since discontinued this med in favor of diet alone... we did f/u FLP w/ TChol 227, LDL 141- started Simva20... ~  October 09, 2009:  she's had a good 20mo, tolerating Simva20 & working well... had plantar fasciitis w/ eval by [facility]- stretching, PT, NSAIDs Prn... no new complaints or concerns... ~  February 11, 2011:  here for CPX & 48mo ROV doing well w/o new complaints or concerns... she does c/o some incr gas & we discussed antigas rx w/ simethacone, etc... she continues to do well on the Simva20...  ~  June 13, 2012:  24mo ROV & CPX> Tammera has had a good yr- feeling well & no new complaints or concerns...    CHOL> on Simva20 & f/u FLP looks good, all parameters at goals (see below); continue same...    GI> GERD, IBS, Polyp> still notes some gas problems but improved on Simethacone preps; she saw DrPyrtle for Colonoscopy 3/13 w/ one 56mm hyperplastic polyp removed, sm int hems seen, f/u planned 10 yrs... We reviewed prob list, meds, xrays and labs> see below>>  LABS 6/13:  FLP- allparameters at goals on Simva20;  Chems- wnl;  CBC- wnl;  TSH=2.37;  UA- clear   ~  August 07, 2013:  35mo ROV & CPX> Garrie reports a good year- no new complaints or concerns...     AR> on Zyrtek OTC as needed...    CHOL> on Simva20 & f/u FLP looks good, all parameters at goals (see below); continue same...    GI> GERD, IBS, Polyp> still notes some gas problems but improved on Simethacone preps; she saw DrPyrtle for Colonoscopy 3/13 w/ one 56mm hyperplastic polyp removed, sm int hems seen, f/u planned 10 yrs... We reviewed prob list, meds, xrays and labs> see below for updates >>   CXR 8/14 showed norm heart size, clear lungs,  NAD...  LABS 8/14:  FLP- at goals on Simva20;  Chems- wnl;  LFTs= wnl;  CBC- ok w/ Hg=12.2 MCV=92;  TSH=2.49...   ~  September 05, 2014:  Yearly ROV & CPX>  Luverta reports a good yr- no new complaints or concerns, she & husb have started running & they competed in a 1/2Marathon... We reviewed the following medical problems during today's office visit >>     AR> on Zyrtek OTC as needed...    CHOL> on Simva20 & f/u FLP looks good, all parameters at goals (see below); continue same...    GI> GERD, IBS, Polyp> still notes some gas problems but improved on Simethacone preps prn; she saw DrPyrtle for Colonoscopy 3/13 w/ one 76mm hyperplastic polyp removed, sm int hems seen, f/u planned 10 yrs... We reviewed prob list, meds, xrays and labs> see below for updates >> OK 2015 Flu shot today  LABS 9/15:  FLP- ok x LDL=111;  Chems- wnl;  CBC- wnl;  TSH=3.46...  ~  September 08, 2015:  Yearly ROV & CPX> Jasreet notes several issues over the last yr- she had been training & running half-marathons w/ her husb but started having some right hip pain; she fell in May & went to [facility] who did an MRI  w/ L4-5 disc degen reported; they also saw a spot on her kidney & she was sent to ?[facility] but their eval indicated that ?one kidney moved & no spot was identified (per pt's recollection- we do not have notes from them);  Now she is c/o occas waking up at night w/ back pain & her exercise has been lim to yoga and elliptical machine per ortho... We reviewed the following medical problems during today's office visit >>     AR> on Zyrtek OTC as needed...    CHOL> on Simva20 & f/u FLP looks good, all parameters at goals (see below); continue same...    GI> GERD, IBS, Polyp> still notes some gas problems but improved on Simethacone preps prn; she saw DrPyrtle for Colonoscopy 3/13 w/ one 21mm hyperplastic polyp removed, sm int hems seen, f/u planned 10 yrs...    GU> ?spot on her kidney per ortho XRays? She was  eval by Urology- nothing found by her report; we have requested records to be sent...    GYN> followed by DrTomblin, everything is ok per pt; she says she had a baseline BMD from them & it was OK...     Ortho> c/o back pain & right hip pain> as above- she had ortho eval DrBeane, we have requested records to be sent... We reviewed prob list, meds, xrays and labs> see below for updates >> OK 2016 Flu shot today & Rx written for shingles vaccine...  LABS 9/16:  FLP- at goals on Simva20;  Chems- wnl;  CBC- wnl w/ Hg=12.4;  TSH= 3/92 IMP/PLAN>>  Good general health, don't know what to make of the back/right hip discomfort/ ortho eval/ urology eval and we will request records to review...           Problem List:    PHYSICAL EXAMINATION (ICD-V70.0) - GYN = DrTomblin for Pap, Mammogram, BMD, etc... she is up to date w/ her check ups... ~  CXR 10/10 showed normal heart size, clear lungs, NAD... ~  EKG 2/12 showed NSR, rate67, WNL.Marland Kitchen. ~  CXR 8/14 showed norm heart size, clear lungs, NAD...  ALLERGIC RHINITIS (ICD-477.9) - OTC Claritin/ Zyrtek as needed.  HYPERCHOLESTEROLEMIA (ICD-272.0) - on SIMVASTATIN 20mg /d + low chol diet... ~  prev FLP's w/ TChol 230-240, TG 60's, HDL 68-70, LDL 160 range. ~  FLP on statin Rx showed TChol 150-170, TG 3-60, HDL 61-67, LDL 83-92 ~  FLP 4/09 showed TChol 227, TG 58, HDL 65, LDL 141... rec> start SIMVA20. ~  [place] 10/09 on Simva20 showed TChol 156, TG 49, HDL 66, LDL 80 ~  FLP 10/10 on Simva20 showed TChol 161, TG 67, HDL 59, LDL 89 ~  FLP 2/12 on Simva20 showed TChol 198, TG 102, HDL 70, LDL 107... we reviewed diet/ exercise. ~  FLP 6/13 on Simva20 showed TChol 179, TG 66, HDL 76, LDL 90 ~  FLP 8/14 on Simva20 showed TChol 174, TG 51, HDL 74, LDL 90 ~  FLP 9/15 on Simva20 showed TChol 191, TG 36, HDL 73, LDL 111... We reviewed diet & exercise. ~  FLP 9/16 on Simva20 showed TChol 169, TG 68, HDL 72, LDL 83  GERD >> no recent problems... prev used Prilosec vs  Ranitadine...  IRRITABLE BOWEL SYNDROME COLON POLYP >> 12mm dec colon polyp removed 2/13= hyperplastic. INTERNAL HEMORRHOIDS ~  She has had complaints of gas & treated w/ OTC Simethacone preps... ~  2/13:  GI eval DrPyrtle for change in bowel habits w/ freq &  intermittent blood seen> neg exam, normal labs including Tissue Transglutaminase & IgA level (neg for Celiac dis)... ~  Colonoscopy 2/13 by DrPyrtle 2/13 showed one 41mm polyp in desc colon- hyperplastic, sm int hems... F/u planned 10 yrs.  Family Hx of Pancreatic Cancer >>  ~  the patient's mother had a history of pancreatic cancer and she discussed this w/ DrPyrtle 2/13;  They reviewed how there are no guidelines currently supporting screening in asymptomatic individuals with a family history of pancreatic cancer (this being based on the fact that there is no known family history of hereditary pancreatitis or known hereditary/genetic cancer syndromes)...  Right hip pain w/ running> reported this in 2016, she's had Ortho eval by DrBeane but we do not have his records... BACK PAIN, LUMBAR (ICD-724.2) - hx lumbar disc dis, no surgery... developed plantar fasciitis 2010 w/ eval by [facility]- stretching, PT, NSAIDs Prn... ~  9/16> she reported right hip pain & back discomfort; eval by [facility] she thinks told degen discs & exercise restricted to yoga & elliptical  ANXIETY (ICD-300.00)   Past Surgical History  Procedure Laterality Date  . Vaginal hysterectomy    . Cesarean section      Outpatient Encounter Prescriptions as of 09/08/2015  Medication Sig  . glucosamine-chondroitin 500-400 MG tablet Take 1 tablet by mouth at bedtime.  . Multiple Vitamins-Minerals (MULTIVITAMIN WITH MINERALS) tablet Take 1 tablet by mouth daily.  . simvastatin (ZOCOR) 20 MG tablet Take 1 tablet by mouth  every evening  . ALPRAZolam (XANAX) 0.5 MG tablet Take 1 tablet (0.5 mg total) by mouth every 6 (six) hours as needed for anxiety.   No  facility-administered encounter medications on file as of 09/08/2015.    Allergies  Allergen Reactions  . Codeine     REACTION: nausea    Current Medications, Allergies, Past Medical History, Past Surgical History, Family History, and Social History were reviewed in [employer] record.   Review of Systems        The patient complains of gas/bloating.  The patient denies fever, chills, sweats, anorexia, fatigue, weakness, malaise, weight loss, sleep disorder, blurring, diplopia, eye irritation, eye discharge, vision loss, eye pain, photophobia, earache, ear discharge, tinnitus, decreased hearing, nasal congestion, nosebleeds, sore throat, hoarseness, chest pain, palpitations, syncope, dyspnea on exertion, orthopnea, PND, peripheral edema, cough, dyspnea at rest, excessive sputum, hemoptysis, wheezing, pleurisy, nausea, vomiting, diarrhea, constipation, change in bowel habits, abdominal pain, melena, hematochezia, jaundice, indigestion/heartburn, dysphagia, odynophagia, dysuria, hematuria, urinary frequency, urinary hesitancy, nocturia, incontinence, back pain, joint pain, joint swelling, muscle cramps, muscle weakness, stiffness, arthritis, sciatica, restless legs, leg pain at night, leg pain with exertion, rash, itching, dryness, suspicious lesions, paralysis, paresthesias, seizures, tremors, vertigo, transient blindness, frequent falls, frequent headaches, difficulty walking, depression, anxiety, memory loss, confusion, cold intolerance, heat intolerance, polydipsia, polyphagia, polyuria, unusual weight change, abnormal bruising, bleeding, enlarged lymph nodes, urticaria, allergic rash, hay fever, and recurrent infections.     Objective:   Physical Exam     WD, WN, 49 y/o WF in NAD... GENERAL:  Alert & oriented; pleasant & cooperative... HEENT:  [place]/AT, EOM-wnl, PERRLA, EACs-clear, TMs-wnl, NOSE-clear, THROAT-clear & wnl. NECK:  Supple w/ full ROM; no JVD; normal carotid  impulses w/o bruits; no thyromegaly or nodules palpated; no lymphadenopathy. CHEST:  Clear to P & A; without wheezes/ rales/ or rhonchi. HEART:  Regular Rhythm; without murmurs/ rubs/ or gallops. ABDOMEN:  Soft & nontender; normal bowel sounds; no organomegaly or masses detected. EXT: without deformities  or arthritic changes; no varicose veins/ venous insuffic/ or edema. NEURO:  CN's intact; motor testing normal; sensory testing normal; gait normal & balance OK. DERM:  No lesions noted; no rash etc...  RADIOLOGY DATA:  Reviewed in the EPIC EMR & discussed w/ the patient...  LABORATORY DATA:  Reviewed in the EPIC EMR & discussed w/ the patient...   Assessment:     CPX>>  AR>  She uses OTC antihist as needed...  CHOL>  Stable on Simva20 & FLP looks good on this dose, tol well, etc...  GERD>  No recent symptoms and she ises OTC PPI vs H2blockers as needed...  IBS, Polyp, Hem>  She had GI eval & colonoscopy 2/13 by DrPyrtle; colon revealed one hyperplastic polyp; symptoms have subsided since them- w/o incr stool freq or recurrent blood...  GU>  Sent to Urology by Ortho w/ ?spot on her kidney; she reports eval was neg, nothing found & we are awaiting ecods to review...  Ortho>  She has back pain issues eval by [facility] & exercise has been lim to yoga 7 elliptical; we will request records to review...      Plan:     Patient's Medications  New Prescriptions   ALPRAZOLAM (XANAX) 0.5 MG TABLET    Take 1 tablet (0.5 mg total) by mouth every 6 (six) hours as needed for anxiety.  Previous Medications   GLUCOSAMINE-CHONDROITIN 500-400 MG TABLET    Take 1 tablet by mouth at bedtime.   MULTIPLE VITAMINS-MINERALS (MULTIVITAMIN WITH MINERALS) TABLET    Take 1 tablet by mouth daily.   SIMVASTATIN (ZOCOR) 20 MG TABLET    Take 1 tablet by mouth  every evening  Modified Medications   No medications on file  Discontinued Medications   No medications on file

## 2015-09-08 NOTE — Patient Instructions (Signed)
Today we updated your med list in our EPIC system...    Continue your current medications the same...    We refilled your simvastatin...  We reviewed your recent fasting labs & gave you copies for your records...  We gave you the 2016 FLU vaccine today...  We wrote a prescription for the SHINGLES vaccine- you may get this shot at [facility] or [facility] shot [facility]...  We will send for records from [facility] as we discussed...  Call for any questions...  Let's plan a follow up visit in 82yr, sooner if needed for problems.Marland KitchenMarland Kitchen

## 2015-10-26 ENCOUNTER — Telehealth: Payer: Self-pay | Admitting: Pulmonary Disease

## 2015-10-26 NOTE — Telephone Encounter (Signed)
Spoke with pt. States that when she was here last with her husband, SN told her that there is a medication we could give them to take prior to leaving the country that would help prevent getting sick. She would like to have this sent in.  SN - please advise. Thanks.

## 2015-10-27 MED ORDER — CIPROFLOXACIN HCL 500 MG PO TABS: 500.0000 mg | ORAL_TABLET | Freq: Two times a day (BID) | ORAL | Status: DC

## 2015-10-27 MED ORDER — ONDANSETRON HCL 4 MG PO TABS: 4.0000 mg | ORAL_TABLET | Freq: Three times a day (TID) | ORAL | Status: DC | PRN

## 2015-10-27 NOTE — Telephone Encounter (Signed)
Spoke with pt, states she is traveling to [place].   Pt aware of below recs.  meds sent to preferred pharmacy.  Nothing further needed.

## 2015-10-27 NOTE — Telephone Encounter (Signed)
Per SN>> Find out where pt is travelling to. Inform pt that there is no medication to take prior to trip to prevent illness but she can take an antibiotic, probiotic, and something for nausea with them to take if they become ill on trip. Call in Cipro 500mg  #14 to take BID until gone and zofran 1 PO Q6 hour prn n/v. Pt can take probiotic like align.   Called and left message for pt to call back

## 2015-10-28 ENCOUNTER — Telehealth: Payer: Self-pay | Admitting: Pulmonary Disease

## 2015-10-29 NOTE — Telephone Encounter (Signed)
Spoke with patient, states that Nothing further needed. Will close encounter.

## 2015-11-19 ENCOUNTER — Encounter: Payer: Self-pay | Admitting: Pulmonary Disease

## 2015-11-27 ENCOUNTER — Telehealth: Payer: Self-pay | Admitting: Pulmonary Disease

## 2015-11-27 DIAGNOSIS — R300 Dysuria: Secondary | ICD-10-CM

## 2015-11-27 NOTE — Telephone Encounter (Signed)
Per SN: Rec U/A with culture, increase fluid intake po Refer to Dr Hilarie Fredrickson for further eval stomach issues- may need EGD ----- Spoke with patient aware that order placed for U/A and culture.  States that she has seen Dr Raquel James within the last 2.5 years and is going to call them to get an appt.  Will call out office if any further questions or concerns.  Will let us know if unable to get appt with GI.  Nothing further needed.

## 2015-11-27 NOTE — Telephone Encounter (Signed)
Spoke with pt, c/o dark urine, burning with urination-pt states she stays well hydrated during the day.  Also feels like when she eats "her food stops at the top of her stomach", unable to eat several times a day or eat large meals.  Pt also notes increased abdominal bloating.  Pt states she has a family hx of esophageal problems.  Pt wants to know what SN suggests-UA, labwork, imaging?  Pt requesting SN's recs.    SN please advise.  Thanks!

## 2015-12-01 ENCOUNTER — Other Ambulatory Visit (INDEPENDENT_AMBULATORY_CARE_PROVIDER_SITE_OTHER): Payer: 59

## 2015-12-01 DIAGNOSIS — R300 Dysuria: Secondary | ICD-10-CM | POA: Diagnosis not present

## 2015-12-01 LAB — URINALYSIS
Bilirubin Urine: NEGATIVE
Hgb urine dipstick: NEGATIVE
KETONES UR: NEGATIVE
Leukocytes, UA: NEGATIVE
NITRITE: NEGATIVE
PH: 5.5 (ref 5.0–8.0)
Specific Gravity, Urine: >=1.03 — AB (ref 1.000–1.030)
Total Protein, Urine: NEGATIVE
UROBILINOGEN UA: 0.2 (ref 0.0–1.0)
Urine Glucose: NEGATIVE

## 2015-12-03 LAB — URINE CULTURE
Colony Count: NO GROWTH
ORGANISM ID, BACTERIA: NO GROWTH

## 2015-12-09 ENCOUNTER — Telehealth: Payer: Self-pay | Admitting: *Deleted

## 2015-12-09 NOTE — Telephone Encounter (Signed)
Patient notified.  No questions or concerns at this time. Nothing further needed.   

## 2015-12-09 NOTE — Telephone Encounter (Signed)
Attempted to contact patient regarding lab results. Left message for patient to call back.   Notes Recorded by Noralee Space, MD on 12/07/2015 at 4:36 PM Please notify patient>  UA is clear and culture finally reported out as NEG, no growth... SMN

## 2016-02-27 ENCOUNTER — Other Ambulatory Visit: Payer: Self-pay | Admitting: Pulmonary Disease

## 2016-08-22 ENCOUNTER — Other Ambulatory Visit: Payer: Self-pay | Admitting: Pulmonary Disease

## 2016-09-07 ENCOUNTER — Encounter (INDEPENDENT_AMBULATORY_CARE_PROVIDER_SITE_OTHER): Payer: Self-pay

## 2016-09-07 ENCOUNTER — Encounter: Payer: Self-pay | Admitting: Pulmonary Disease

## 2016-09-07 ENCOUNTER — Ambulatory Visit (INDEPENDENT_AMBULATORY_CARE_PROVIDER_SITE_OTHER): Payer: 59 | Admitting: Pulmonary Disease

## 2016-09-07 ENCOUNTER — Other Ambulatory Visit (INDEPENDENT_AMBULATORY_CARE_PROVIDER_SITE_OTHER): Payer: 59

## 2016-09-07 ENCOUNTER — Ambulatory Visit (INDEPENDENT_AMBULATORY_CARE_PROVIDER_SITE_OTHER)
Admission: RE | Admit: 2016-09-07 | Discharge: 2016-09-07 | Disposition: A | Discharge status: Home / Self Care | Payer: 59 | Source: Ambulatory Visit | Attending: Pulmonary Disease | Admitting: Pulmonary Disease

## 2016-09-07 VITALS — BP 110/64 | HR 64 | Temp 97.0°F | Ht 66.5 in | Wt 153.0 lb

## 2016-09-07 DIAGNOSIS — Z23 Encounter for immunization: Secondary | ICD-10-CM | POA: Diagnosis not present

## 2016-09-07 DIAGNOSIS — K219 Gastro-esophageal reflux disease without esophagitis: Secondary | ICD-10-CM

## 2016-09-07 DIAGNOSIS — M542 Cervicalgia: Secondary | ICD-10-CM | POA: Diagnosis not present

## 2016-09-07 DIAGNOSIS — F411 Generalized anxiety disorder: Secondary | ICD-10-CM

## 2016-09-07 DIAGNOSIS — K589 Irritable bowel syndrome without diarrhea: Secondary | ICD-10-CM

## 2016-09-07 DIAGNOSIS — Z86010 Personal history of colonic polyps: Secondary | ICD-10-CM

## 2016-09-07 DIAGNOSIS — Z0000 Encounter for general adult medical examination without abnormal findings: Secondary | ICD-10-CM

## 2016-09-07 DIAGNOSIS — M545 Low back pain: Secondary | ICD-10-CM

## 2016-09-07 DIAGNOSIS — E7800 Pure hypercholesterolemia, unspecified: Secondary | ICD-10-CM

## 2016-09-07 DIAGNOSIS — G8929 Other chronic pain: Secondary | ICD-10-CM

## 2016-09-07 LAB — CBC WITH DIFFERENTIAL/PLATELET
BASOS ABS: 0.1 10*3/uL (ref 0.0–0.1)
Basophils Relative: 1 % (ref 0.0–3.0)
EOS ABS: 0.2 10*3/uL (ref 0.0–0.7)
Eosinophils Relative: 3.3 % (ref 0.0–5.0)
HCT: 38.2 % (ref 36.0–46.0)
Hemoglobin: 13.3 g/dL (ref 12.0–15.0)
LYMPHS ABS: 1.5 10*3/uL (ref 0.7–4.0)
Lymphocytes Relative: 29.5 % (ref 12.0–46.0)
MCHC: 34.7 g/dL (ref 30.0–36.0)
MCV: 89.9 fl (ref 78.0–100.0)
Monocytes Absolute: 0.5 10*3/uL (ref 0.1–1.0)
Monocytes Relative: 9.5 % (ref 3.0–12.0)
NEUTROS ABS: 2.9 10*3/uL (ref 1.4–7.7)
NEUTROS PCT: 56.7 % (ref 43.0–77.0)
PLATELETS: 323 10*3/uL (ref 150.0–400.0)
RBC: 4.25 Mil/uL (ref 3.87–5.11)
RDW: 13.3 % (ref 11.5–15.5)
WBC: 5.2 10*3/uL (ref 4.0–10.5)

## 2016-09-07 LAB — COMPREHENSIVE METABOLIC PANEL
ALT: 21 U/L (ref 0–35)
AST: 22 U/L (ref 0–37)
Albumin: 4.2 g/dL (ref 3.5–5.2)
Alkaline Phosphatase: 52 U/L (ref 39–117)
BUN: 18 mg/dL (ref 6–23)
CALCIUM: 9.4 mg/dL (ref 8.4–10.5)
CHLORIDE: 103 meq/L (ref 96–112)
CO2: 31 meq/L (ref 19–32)
CREATININE: 0.68 mg/dL (ref 0.40–1.20)
GFR: 96.55 mL/min (ref >60.00)
Glucose, Bld: 83 mg/dL (ref 70–99)
POTASSIUM: 4.2 meq/L (ref 3.5–5.1)
SODIUM: 139 meq/L (ref 135–145)
Total Bilirubin: 0.5 mg/dL (ref 0.2–1.2)
Total Protein: 7.5 g/dL (ref 6.0–8.3)

## 2016-09-07 LAB — LIPID PANEL
CHOL/HDL RATIO: 2
Cholesterol: 199 mg/dL (ref 0–200)
HDL: 79.9 mg/dL (ref >39.00)
LDL CALC: 101 mg/dL — AB (ref 0–99)
NONHDL: 119.53
Triglycerides: 93 mg/dL (ref 0.0–149.0)
VLDL: 18.6 mg/dL (ref 0.0–40.0)

## 2016-09-07 LAB — VITAMIN D 25 HYDROXY (VIT D DEFICIENCY, FRACTURES): VITD: 50.35 ng/mL (ref 30.00–100.00)

## 2016-09-07 LAB — TSH: TSH: 4.83 u[IU]/mL — AB (ref 0.35–4.50)

## 2016-09-07 MED ORDER — SIMVASTATIN 20 MG PO TABS: 20.0000 mg | ORAL_TABLET | Freq: Every evening | ORAL | 3 refills | Status: DC

## 2016-09-07 NOTE — Patient Instructions (Signed)
Today we updated your med list in our EPIC system...    Continue your current medications the same...  Today we did your follow up CXR, EKG & FASTING blood work...    We will contact you w/ the results when available...   We will check NECK XRAY & let you know what that shows as well...  In the meanwhile-- try topical heat, patches, and the "KT Strips"...    You may also use Aleve, Tylenol, etc...  We gave you the 2017 FLU vaccine today...  Call for any questions...  Let's plan a follow up visit in 35yr, sooner if needed for problems.Marland KitchenMarland Kitchen

## 2016-09-07 NOTE — Progress Notes (Signed)
Subjective:     Patient ID: Kylie Miller, female   DOB: 02/29/1966, 50 y.o.   MRN: [id number]  HPI 50 y/o WF here for a follow up visit and CPX...   ~  [name]:  she was last seen [name] doing well... we decided to start Lipitor 10mg /d for her Chol with a great response, but she has since discontinued this med in favor of diet alone... we did f/u FLP w/ TChol 227, LDL 141- started Simva20... ~  October 09, 2009:  she's had a good 14mo, tolerating Simva20 & working well... had plantar fasciitis w/ eval by [facility]- stretching, PT, NSAIDs Prn... no new complaints or concerns... ~  February 11, 2011:  here for CPX & 84mo ROV doing well w/o new complaints or concerns... she does c/o some incr gas & we discussed antigas rx w/ simethacone, etc... she continues to do well on the Simva20...  ~  June 13, 2012:  19mo ROV & CPX> [name] has had a good yr- feeling well & no new complaints or concerns...    CHOL> on Simva20 & f/u FLP looks good, all parameters at goals (see below); continue same...    GI> GERD, IBS, Polyp> still notes some gas problems but improved on Simethacone preps; she saw DrPyrtle for Colonoscopy 3/13 w/ one 52mm hyperplastic polyp removed, sm int hems seen, f/u planned 10 yrs... We reviewed prob list, meds, xrays and labs> see below>>  LABS 6/13:  FLP- allparameters at goals on Simva20;  Chems- wnl;  CBC- wnl;  TSH=2.37;  UA- clear   ~  August 07, 2013:  56mo ROV & CPX> Kylie Miller reports a good year- no new complaints or concerns...     AR> on Zyrtek OTC as needed...    CHOL> on Simva20 & f/u FLP looks good, all parameters at goals (see below); continue same...    GI> GERD, IBS, Polyp> still notes some gas problems but improved on Simethacone preps; she saw DrPyrtle for Colonoscopy 3/13 w/ one 102mm hyperplastic polyp removed, sm int hems seen, f/u planned 10 yrs... We reviewed prob list, meds, xrays and labs> see below for updates >>   CXR 8/14 showed norm heart size, clear lungs,  NAD...  LABS 8/14:  FLP- at goals on Simva20;  Chems- wnl;  LFTs= wnl;  CBC- ok w/ Hg=12.2 MCV=92;  TSH=2.49...   ~  September 05, 2014:  Yearly ROV & CPX>  Kylie Miller reports a good yr- no new complaints or concerns, she & husb have started running & they competed in a 1/2Marathon... We reviewed the following medical problems during today's office visit >>     AR> on Zyrtek OTC as needed...    CHOL> on Simva20 & f/u FLP looks good, all parameters at goals (see below); continue same...    GI> GERD, IBS, Polyp> still notes some gas problems but improved on Simethacone preps prn; she saw DrPyrtle for Colonoscopy 3/13 w/ one 84mm hyperplastic polyp removed, sm int hems seen, f/u planned 10 yrs... We reviewed prob list, meds, xrays and labs> see below for updates >> OK 2015 Flu shot today  LABS 9/15:  FLP- ok x LDL=111;  Chems- wnl;  CBC- wnl;  TSH=3.46...  ~  September 08, 2015:  Yearly ROV & CPX> Kylie Miller notes several issues over the last yr- she had been training & running half-marathons w/ her husb but started having some right hip pain; she fell in May & went to [facility] who did an MRI  w/ L4-5 disc degen reported; they also saw a spot on her kidney & she was sent to ?[facility] but their eval indicated that ?one kidney moved & no spot was identified (per pt's recollection- we do not have notes from them);  Now she is c/o occas waking up at night w/ back pain & her exercise has been lim to yoga and elliptical machine per ortho... We reviewed the following medical problems during today's office visit >>     AR> on Zyrtek OTC as needed...    CHOL> on Simva20 & f/u FLP looks good, all parameters at goals (see below); continue same...    GI> GERD, IBS, Polyp> still notes some gas problems but improved on Simethacone preps prn; she saw DrPyrtle for Colonoscopy 3/13 w/ one 658m hyperplastic polyp removed, sm int hems seen, f/u planned 10 yrs...    GU> ?spot on her kidney per ortho XRays? She was  eval by Urology- nothing found by her report; we have requested records to be sent...    GYN> followed by DrTomblin, everything is ok per pt; she says she had a baseline BMD from them & it was OK...     Ortho> c/o back pain & right hip pain> as above- she had ortho eval DrBeane, we have requested records to be sent... We reviewed prob list, meds, xrays and labs> see below for updates >> OK 2016 Flu shot today & Rx written for shingles vaccine...  LABS 9/16:  FLP- at goals on Simva20;  Chems- wnl;  CBC- wnl w/ Hg=12.4;  TSH= 3/92 IMP/PLAN>>  Good general health, don't know what to make of the back/right hip discomfort/ ortho eval/ urology eval and we will request records to review...   ~  September 07, 2016:  Yearly ROV & CPX... 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 We reviewed the following medical problems during today's office visit >>     AR> on Zyrtek OTC as needed...    CHOL> on Simva20 & f/u FLP looks good, all parameters at goals (see below); continue same...    GI> GERD, IBS, Polyp> still notes some gas problems but improved on Simethacone preps prn; she saw DrPyrtle for Colonoscopy 3/13 w/ one 58m34myperplastic polyp removed, sm int hems seen, f/u planned 10 yrs...    GU> ?spot on her kidney per ortho XRays? She was eval by Urology- nothing found by her report; we have requested records to be sent...    GYN> followed by DrTomblin, everything is ok per pt; she says she had a baseline BMD from them & it was OK...     Ortho> c/o back pain & right hip pain> as above- she had ortho eval DrBeane, we have requested records to be sent... EXAM shows Afeb, VSS, O2sat=98% on  RA at rest;  HEENT- neg, mallampati2;  Chest- clear w/o w/r/r;  Heart- RR w/o m/r/g;  Abd- soft, nontender, neg;  Ext- neg w/o c/c/e;  Neuro- intact...  CXR 09/07/16>  Norm heart size, clear lungs, NAD, mild dengen changes in Tspine...  XRays Cspine 09/07/16>  No fx or dislocation, mild degenerative changes w/ disc  sp flattening at C5-6 level & mild spurring...  EKG 09/07/16>  SBrady, rate58, wnl...  LABS 09/07/16>  FLP- all parameters at goals on Simva20;  Chems- wnl;  CBC- wnl;  TSH=4.83 borderline elev;  VitD=50... IMP/PLAN>>  Galya is under some stress w/ work- advised on Alpraz dosing;  She will try rest, heat, topical meds, Aleve as needed for neck + KT strips etc;  Given 2018 FLU vaccine...          Problem List:    PHYSICAL EXAMINATION (ICD-V70.0) - GYN = DrTomblin for Pap, Mammogram, BMD, etc... she is up to date w/ her check ups... ~  CXR 10/10 showed normal heart size, clear lungs, NAD... ~  EKG 2/12 showed NSR, rate67, WNL.Marland Kitchen. ~  CXR 8/14 showed norm heart size, clear lungs, NAD...  ALLERGIC RHINITIS (ICD-477.9) - OTC Claritin/ Zyrtek as needed.  HYPERCHOLESTEROLEMIA (ICD-272.0) - on SIMVASTATIN 20mg /d + low chol diet... ~  prev FLP's w/ TChol 230-240, TG 60's, HDL 68-70, LDL 160 range. ~  FLP on statin Rx showed TChol 150-170, TG 3-60, HDL 61-67, LDL 83-92 ~  FLP 4/09 showed TChol 227, TG 58, HDL 65, LDL 141... rec> start SIMVA20. ~  [place] 10/09 on Simva20 showed TChol 156, TG 49, HDL 66, LDL 80 ~  FLP 10/10 on Simva20 showed TChol 161, TG 67, HDL 59, LDL 89 ~  FLP 2/12 on Simva20 showed TChol 198, TG 102, HDL 70, LDL 107... we reviewed diet/ exercise. ~  FLP 6/13 on Simva20 showed TChol 179, TG 66, HDL 76, LDL 90 ~  FLP 8/14 on Simva20 showed TChol 174, TG 51, HDL 74, LDL 90 ~  FLP 9/15 on Simva20 showed TChol 191, TG 36, HDL 73, LDL 111... We reviewed diet & exercise. ~  FLP 9/16 on Simva20 showed TChol 169, TG 68, HDL 72, LDL 83 ~  FLP 9/17 on simva20 showed TChol 199, TG  93, HDL 80, LDL 101  GERD >> no recent problems... prev used Prilosec vs Ranitadine...  IRRITABLE BOWEL SYNDROME COLON POLYP >> 15mm dec colon polyp removed 2/13= hyperplastic. INTERNAL HEMORRHOIDS ~  She has had complaints of gas & treated w/ OTC Simethacone preps... ~  2/13:  GI eval DrPyrtle for change in bowel habits w/ freq & intermittent blood seen> neg exam, normal labs including Tissue Transglutaminase & IgA level (neg for Celiac dis)... ~  Colonoscopy 2/13 by DrPyrtle 2/13 showed one 52mm polyp in desc colon- hyperplastic, sm int hems... F/u planned 10 yrs.  Family Hx of Pancreatic Cancer >>  ~  the patient's mother had a history of pancreatic cancer and she discussed this w/ DrPyrtle 2/13;  They reviewed how there are no guidelines currently supporting screening in asymptomatic individuals with a family history of pancreatic cancer (this being based on the fact that there is no known family history of hereditary pancreatitis or known hereditary/genetic cancer syndromes)...  Right hip pain w/ running> reported this in 2016, she's had Ortho eval by DrBeane but we do not have his records... BACK PAIN, LUMBAR (ICD-724.2) - hx lumbar disc dis, no surgery... developed plantar fasciitis 2010 w/ eval by [facility]- stretching, PT, NSAIDs Prn... ~  9/16> she reported right hip pain & back discomfort; eval by [facility] she thinks told degen discs & exercise restricted to yoga & elliptical  ANXIETY (ICD-300.00)   Past Surgical History:  Procedure Laterality Date  . CESAREAN SECTION    . VAGINAL HYSTERECTOMY      Outpatient Encounter Prescriptions  as of 09/07/2016  Medication Sig Dispense Refill  . ALPRAZolam (XANAX) 0.5 MG tablet Take 1 tablet (0.5 mg total) by mouth every 6 (six) hours as needed for anxiety. 50 tablet 1  . ciprofloxacin (CIPRO) 500 MG tablet Take 1 tablet (500 mg total) by mouth 2 (two) times daily. 14 tablet 0  . glucosamine-chondroitin 500-400 MG tablet Take 1  tablet by mouth at bedtime.    . Multiple Vitamins-Minerals (MULTIVITAMIN WITH MINERALS) tablet Take 1 tablet by mouth daily.    . ondansetron (ZOFRAN) 4 MG tablet Take 1 tablet (4 mg total) by mouth every 8 (eight) hours as needed for nausea or vomiting. 90 tablet 0  . simvastatin (ZOCOR) 20 MG tablet Take 1 tablet (20 mg total) by mouth every evening. 90 tablet 3  . [DISCONTINUED] simvastatin (ZOCOR) 20 MG tablet Take 1 tablet by mouth  every evening 90 tablet 1   No facility-administered encounter medications on file as of 09/07/2016.     Allergies  Allergen Reactions  . Codeine     REACTION: nausea    Current Medications, Allergies, Past Medical History, Past Surgical History, Family History, and Social History were reviewed in [employer] record.   Review of Systems        The patient complains of gas/bloating.  The patient denies fever, chills, sweats, anorexia, fatigue, weakness, malaise, weight loss, sleep disorder, blurring, diplopia, eye irritation, eye discharge, vision loss, eye pain, photophobia, earache, ear discharge, tinnitus, decreased hearing, nasal congestion, nosebleeds, sore throat, hoarseness, chest pain, palpitations, syncope, dyspnea on exertion, orthopnea, PND, peripheral edema, cough, dyspnea at rest, excessive sputum, hemoptysis, wheezing, pleurisy, nausea, vomiting, diarrhea, constipation, change in bowel habits, abdominal pain, melena, hematochezia, jaundice, indigestion/heartburn, dysphagia, odynophagia, dysuria, hematuria, urinary frequency, urinary hesitancy, nocturia, incontinence, back pain, joint pain, joint swelling, muscle cramps, muscle weakness, stiffness, arthritis, sciatica, restless legs, leg pain at night, leg pain with exertion, rash, itching, dryness, suspicious lesions, paralysis, paresthesias, seizures, tremors, vertigo, transient blindness, frequent falls, frequent headaches, difficulty walking, depression, anxiety, memory  loss, confusion, cold intolerance, heat intolerance, polydipsia, polyphagia, polyuria, unusual weight change, abnormal bruising, bleeding, enlarged lymph nodes, urticaria, allergic rash, hay fever, and recurrent infections.     Objective:   Physical Exam     WD, WN, 50 y/o WF in NAD... GENERAL:  Alert & oriented; pleasant & cooperative... HEENT:  [place]/AT, EOM-wnl, PERRLA, EACs-clear, TMs-wnl, NOSE-clear, THROAT-clear & wnl. NECK:  Supple w/ full ROM; no JVD; normal carotid impulses w/o bruits; no thyromegaly or nodules palpated; no lymphadenopathy. CHEST:  Clear to P & A; without wheezes/ rales/ or rhonchi. HEART:  Regular Rhythm; without murmurs/ rubs/ or gallops. ABDOMEN:  Soft & nontender; normal bowel sounds; no organomegaly or masses detected. EXT: without deformities or arthritic changes; no varicose veins/ venous insuffic/ or edema. NEURO:  CN's intact; motor testing normal; sensory testing normal; gait normal & balance OK. DERM:  No lesions noted; no rash etc...  RADIOLOGY DATA:  Reviewed in the EPIC EMR & discussed w/ the patient...  LABORATORY DATA:  Reviewed in the EPIC EMR & discussed w/ the patient...   Assessment:     CPX>>  09/07/16>  Cecelia is under some stress w/ work- advised on Alpraz dosing;  She will try rest, heat, topical meds, Aleve as needed for neck + KT strips etc;  Given 2018 FLU vaccine  AR>  She uses OTC antihist as needed...  CHOL>  Stable on Simva20 & FLP looks good on this  dose, tol well, etc...  GERD>  No recent symptoms and she ises OTC PPI vs H2blockers as needed...  IBS, Polyp, Hem>  She had GI eval & colonoscopy 2/13 by DrPyrtle; colon revealed one hyperplastic polyp; symptoms have subsided since them- w/o incr stool freq or recurrent blood...  GU>  Sent to Urology by Ortho w/ ?spot on her kidney; she reports eval was neg, nothing found & we are awaiting ecods to review...  Ortho>  She has back pain issues eval by [facility] & exercise has been  lim to yoga 7 elliptical; we will request records to review...      Plan:     Patient's Medications  New Prescriptions   No medications on file  Previous Medications   ALPRAZOLAM (XANAX) 0.5 MG TABLET    Take 1 tablet (0.5 mg total) by mouth every 6 (six) hours as needed for anxiety.   CIPROFLOXACIN (CIPRO) 500 MG TABLET    Take 1 tablet (500 mg total) by mouth 2 (two) times daily.   GLUCOSAMINE-CHONDROITIN 500-400 MG TABLET    Take 1 tablet by mouth at bedtime.   MULTIPLE VITAMINS-MINERALS (MULTIVITAMIN WITH MINERALS) TABLET    Take 1 tablet by mouth daily.   ONDANSETRON (ZOFRAN) 4 MG TABLET    Take 1 tablet (4 mg total) by mouth every 8 (eight) hours as needed for nausea or vomiting.  Modified Medications   Modified Medication Previous Medication   SIMVASTATIN (ZOCOR) 20 MG TABLET simvastatin (ZOCOR) 20 MG tablet      Take 1 tablet (20 mg total) by mouth every evening.    Take 1 tablet by mouth  every evening  Discontinued Medications   No medications on file

## 2016-09-09 ENCOUNTER — Telehealth: Payer: Self-pay | Admitting: Pulmonary Disease

## 2016-09-09 NOTE — Telephone Encounter (Signed)
Noralee Space, MD  Elie Confer, CMA        Please notify patient>   XRay of Cspine shows no evidof fx etc, mild disc space narrowing C5-6, & mild post spurring...  Hopefully this would respond to rest, heat, and OTC anti-inflamm Rx like aleve...    Noralee Space, MD  Elie Confer, CMA        Please notify patient>   CXR is clear & WNL x for some degen changes in Tspine...    Noralee Space, MD  Elie Confer, CMA        Please notify patient> Labs look OK w/ borderline TSH- rec repeat TSH in 19mo...  FLP, Chems, CBC, VitD> ALL WNL & at goals...  TSH (thyroid) is borderline- REC repeat TSH in 25mo rather than waiting 76yr.  Please place order for TSH [id number] & have her mark her calendar.  ----  I spoke with patient about results and she verbalized understanding and had no questions.

## 2016-12-23 ENCOUNTER — Telehealth: Payer: Self-pay | Admitting: Pulmonary Disease

## 2016-12-23 NOTE — Telephone Encounter (Signed)
That's fine

## 2016-12-23 NOTE — Telephone Encounter (Signed)
MW  Please Advise  This is a pt. Of SN. She is aware that Dr. Lenna Gilford is not in the office until Monday but she wanted to see if another provider would be willing to place the order. Pt. Called and stated she spoke with someone with her bennycard about getting a humidifier due to her house running on gas heat and her sinus issues and they stated that they would pay for this if she gets a prescription from a physician.    [phone] Cellphone number for pt.

## 2016-12-23 NOTE — Telephone Encounter (Signed)
Order written and mailed to the pt her her request Nothing further needed

## 2017-01-17 DIAGNOSIS — H2513 Age-related nuclear cataract, bilateral: Secondary | ICD-10-CM | POA: Diagnosis not present

## 2017-02-08 ENCOUNTER — Telehealth: Payer: Self-pay | Admitting: Pulmonary Disease

## 2017-02-08 DIAGNOSIS — R7989 Other specified abnormal findings of blood chemistry: Secondary | ICD-10-CM

## 2017-02-08 NOTE — Telephone Encounter (Signed)
Per SN---  Gyn usually recs black cohosh herbal otc supplement She could discuss this with her GYN.    Called to advise the pt of this and she stated that this is what she was taking before and she feels that this is what messed with her thyroid. She has been off of this since Sept.  And she stated that she will wait until after these labs come back, then we will decide from there.

## 2017-02-08 NOTE — Telephone Encounter (Signed)
Per SN documentation, pt is have her TSH rechecked in March 2018. Spoke with pt. States that she would like to go ahead and have this done. Pt is having issues with hot flashes and struggling losing weight. Order has been placed for TSH. She would like to know if SN has any recommendations for medications for hot flashes.  SN - please advise. Thanks.

## 2017-02-10 ENCOUNTER — Other Ambulatory Visit (INDEPENDENT_AMBULATORY_CARE_PROVIDER_SITE_OTHER): Payer: 59

## 2017-02-10 DIAGNOSIS — R946 Abnormal results of thyroid function studies: Secondary | ICD-10-CM | POA: Diagnosis not present

## 2017-02-10 DIAGNOSIS — R7989 Other specified abnormal findings of blood chemistry: Secondary | ICD-10-CM

## 2017-02-10 LAB — TSH: TSH: 2.13 u[IU]/mL (ref 0.35–4.50)

## 2017-02-15 ENCOUNTER — Encounter: Payer: Self-pay | Admitting: *Deleted

## 2017-04-19 DIAGNOSIS — Z01419 Encounter for gynecological examination (general) (routine) without abnormal findings: Secondary | ICD-10-CM | POA: Diagnosis not present

## 2017-04-21 ENCOUNTER — Encounter: Payer: Self-pay | Admitting: Internal Medicine

## 2017-04-21 ENCOUNTER — Ambulatory Visit (INDEPENDENT_AMBULATORY_CARE_PROVIDER_SITE_OTHER)
Admission: RE | Admit: 2017-04-21 | Discharge: 2017-04-21 | Disposition: A | Discharge status: Home / Self Care | Payer: Commercial Managed Care - HMO | Source: Ambulatory Visit | Attending: Internal Medicine | Admitting: Internal Medicine

## 2017-04-21 ENCOUNTER — Ambulatory Visit (INDEPENDENT_AMBULATORY_CARE_PROVIDER_SITE_OTHER): Payer: Commercial Managed Care - HMO | Admitting: Internal Medicine

## 2017-04-21 DIAGNOSIS — J209 Acute bronchitis, unspecified: Secondary | ICD-10-CM

## 2017-04-21 DIAGNOSIS — R05 Cough: Secondary | ICD-10-CM | POA: Diagnosis not present

## 2017-04-21 DIAGNOSIS — R0602 Shortness of breath: Secondary | ICD-10-CM | POA: Diagnosis not present

## 2017-04-21 MED ORDER — AZITHROMYCIN 250 MG PO TABS: ORAL_TABLET | ORAL | 0 refills | Status: DC

## 2017-04-21 MED ORDER — PREDNISONE 10 MG PO TABS: ORAL_TABLET | ORAL | 0 refills | Status: DC

## 2017-04-21 NOTE — Progress Notes (Signed)
Subjective:     Patient ID: Kylie Miller, female   DOB: Feb 11, 1966, 51 y.o.   MRN: [id number]  HPI    OV 04/21/2017  Chief Complaint  Patient presents with  . Acute Visit    SN pt c/o increased SOB, chest tightness, sometimes prod cough with yellow mucus, chest congestion, PND, sinus congestion X1 week.      51 year old female patient of Dr. Teressa Lower  For  primary care issues. No prior history of asthma but does have spring allergies. No family history of asthma. No history of recurrent pneumonia. She is a nonsmoker. She has no mold exposure. She reports 1 week history of acute on chronic sinus congestion. This then starting 4 or 5 days ago descended into the chest with constant cough and chest tightness and a feeling of inability to get a full deep breaths. There is associated white sputum. She does wake up in the night. She's not sure if she has fevers because she is unable to differentiate this from hot flashes. Test today for routine gynecologic exam she had complete blood count and was called in saying that she had elevated white cell count. I do not have these results with me. Previous use nasal count is documented below. Baseline white cell count is 5000. Chest x-ray 09/07/2016 is clear lung fields and personally visualized.  Results for Kylie Miller (MRN [id number]) as of 04/21/2017 11:28  Ref. Range 08/05/2013 09:34 09/05/2014 10:04 09/03/2015 09:36 09/07/2016 10:18 02/10/2017 14:47  Eosinophils Absolute Latest Ref Range: 0.0 - 0.7 K/uL 0.1 0.2 0.3 0.2   Results for Kylie Miller (MRN [id number]) as of 04/21/2017 11:28  Ref. Range 08/05/2013 09:34 09/05/2014 10:04 09/03/2015 09:36 09/07/2016 10:18 02/10/2017 14:47  WBC Latest Ref Range: 4.0 - 10.5 K/uL 4.9 4.9 5.3 5.2     Exhaled nitric oxide today in the office   has a past medical history of Allergic rhinitis; Anxiety; GERD (gastroesophageal reflux disease); Hyperlipidemia; IBS (irritable bowel syndrome); and Lumbar back pain.   reports that she has never smoked. She has never used smokeless tobacco.  Past Surgical History:  Procedure Laterality Date  . CESAREAN SECTION    . VAGINAL HYSTERECTOMY      Allergies  Allergen Reactions  . Codeine     REACTION: nausea    Immunization History  Administered Date(s) Administered  . Influenza Split 09/19/2011, 10/07/2012  . Influenza,inj,Quad PF,36+ Mos 09/05/2014, 09/08/2015, 09/07/2016  . Tdap 03/29/2013  . Zoster 03/07/2016    Family History  Problem Relation Age of Onset  . Colon cancer Paternal Uncle     x 2  . Colon cancer Maternal Aunt   . Breast cancer Mother   . Pancreatic cancer Mother   . Heart disease Maternal Grandfather   . Stomach cancer Paternal Grandfather   . Thyroid cancer Maternal Grandmother      Current Outpatient Prescriptions:  .  ALPRAZolam (XANAX) 0.5 MG tablet, Take 1 tablet (0.5 mg total) by mouth every 6 (six) hours as needed for anxiety., Disp: 50 tablet, Rfl: 1 .  calcium carbonate (OS-CAL) 1250 (500 Ca) MG chewable tablet, Chew 1 tablet by mouth daily., Disp: , Rfl:  .  glucosamine-chondroitin 500-400 MG tablet, Take 1 tablet by mouth at bedtime., Disp: , Rfl:  .  Multiple Vitamins-Minerals (MULTIVITAMIN WITH MINERALS) tablet, Take 1 tablet by mouth daily., Disp: , Rfl:  .  simvastatin (ZOCOR) 20 MG tablet, Take 1 tablet (20 mg total) by mouth every evening., Disp: 90  tablet, Rfl: 3   Review of Systems     Objective:   Physical Exam  Constitutional: She is oriented to person, place, and time. She appears well-developed and well-nourished. No distress.  HENT:  Head: Normocephalic and atraumatic.  Right Ear: External ear normal.  Left Ear: External ear normal.  Mouth/Throat: Oropharynx is clear and moist. No oropharyngeal exudate.  Does have cough periodically  Eyes: Conjunctivae and EOM are normal. Pupils are equal, round, and reactive to light. Right eye exhibits no discharge. Left eye exhibits no discharge. No  scleral icterus.  Neck: Normal range of motion. Neck supple. No JVD present. No tracheal deviation present. No thyromegaly present.  Cardiovascular: Normal rate, regular rhythm, normal heart sounds and intact distal pulses.  Exam reveals no gallop and no friction rub.   No murmur heard. Pulmonary/Chest: Effort normal and breath sounds normal. No respiratory distress. She has no wheezes. She has no rales. She exhibits no tenderness.  Coarse breath sounds with? One area of wheeze  Abdominal: Soft. Bowel sounds are normal. She exhibits no distension and no mass. There is no tenderness. There is no rebound and no guarding.  Musculoskeletal: Normal range of motion. She exhibits no edema or tenderness.  Lymphadenopathy:    She has no cervical adenopathy.  Neurological: She is alert and oriented to person, place, and time. She has normal reflexes. No cranial nerve deficit. She exhibits normal muscle tone. Coordination normal.  Skin: Skin is warm and dry. No rash noted. She is not diaphoretic. No erythema. No pallor.  Psychiatric: She has a normal mood and affect. Her behavior is normal. Judgment and thought content normal.  Vitals reviewed.  Vitals:   04/21/17 1044  BP: 118/66  Pulse: 94  Temp: 98.8 F (37.1 C)  TempSrc: Oral  SpO2: 98%  Weight: 150 lb (68 kg)  Height: 5' 6.5" (1.689 m)    Estimated body mass index is 23.85 kg/m as calculated from the following:   Height as of this encounter: 5' 6.5" (1.689 m).   Weight as of this encounter: 150 lb (68 kg).     Assessment:       ICD-9-CM ICD-10-CM   1. Acute bronchitis, unspecified organism 466.0 J20.9         Plan:     Acute bronchitis Acute bronchitis ? Due to virus v pollen v spread of sinus issues Need to rule out pneuomonia Unable to rule out or rule in asthma due to inability to do FeNO test  Plan - CXR 2 view to rule out pneumonia  - sign release for CBC with diff from Dr Gertie Fey to look at eosinophils as surrogate  marker for asthma  - Will treat for both pneumonia and asthma empirically - Z PAK - Please take prednisone 40 mg x1 day, then 30 mg x1 day, then 20 mg x1 day, then 10 mg x1 day, and then 5 mg x1 day and stop   Followup  - with Dr Lenna Gilford as previously scheduled or come baack sooner if unresolved - ER if worse    Dr. Brand Males, M.D., [facility].C.P Pulmonary and Critical Care Medicine Staff Physician [place] [facility] Pager: [phone], If no answer or between  15:00h - 7:00h: call [phone]  04/21/2017 11:38 AM

## 2017-04-21 NOTE — Assessment & Plan Note (Signed)
Acute bronchitis ? Due to virus v pollen v spread of sinus issues Need to rule out pneuomonia Unable to rule out or rule in asthma due to inability to do FeNO test  Plan - CXR 2 view to rule out pneumonia  - sign release for CBC with diff from Dr Gertie Fey to look at eosinophils as surrogate marker for asthma  - Will treat for both pneumonia and asthma empirically - Z PAK - Please take prednisone 40 mg x1 day, then 30 mg x1 day, then 20 mg x1 day, then 10 mg x1 day, and then 5 mg x1 day and stop   Followup  - with Dr Lenna Gilford as previously scheduled or come baack sooner if unresolved - ER if worse

## 2017-04-21 NOTE — Addendum Note (Signed)
Addended by: Collier Salina on: 04/21/2017 11:46 AM   Modules accepted: Orders

## 2017-04-21 NOTE — Patient Instructions (Signed)
Acute bronchitis Acute bronchitis ? Due to virus v pollen v spread of sinus issues Need to rule out pneuomonia Unable to rule out or rule in asthma due to inability to do FeNO test  Plan - CXR 2 view to rule out pneumonia  - sign release for CBC with diff from Dr Gertie Fey to look at eosinophils as surrogate marker for asthma  - Will treat for both pneumonia and asthma empirically - Z PAK - Please take prednisone 40 mg x1 day, then 30 mg x1 day, then 20 mg x1 day, then 10 mg x1 day, and then 5 mg x1 day and stop   Followup  - with Dr Lenna Gilford as previously scheduled or come baack sooner if unresolved - ER if worse

## 2017-04-26 ENCOUNTER — Telehealth: Payer: Self-pay | Admitting: Internal Medicine

## 2017-04-26 NOTE — Telephone Encounter (Signed)
cxr for Kylie Miller showed some basal atelectasis. Her next appt with Dr Lenna Gilford is in sept 2018. I think she should come and see him or APP in 6-8 weeks  Dr. Brand Males, M.D., [facility].C.P Pulmonary and Critical Care Medicine Staff Physician [place] [facility] Pager: [phone], If no answer or between  15:00h - 7:00h: call [phone]  04/26/2017 5:28 PM  \

## 2017-04-28 NOTE — Telephone Encounter (Signed)
Called and spoke to pt. Informed her of the results and recs per MR. Appt made with SN on 06/12/17. Pt verbalized understanding and denied any further questions or concerns at this time.

## 2017-05-01 ENCOUNTER — Telehealth: Payer: Self-pay | Admitting: Pulmonary Disease

## 2017-05-01 NOTE — Telephone Encounter (Signed)
Called and spoke with pt and she is curious about her cxr.  She stated that she finished the zpak and pred taper that was given to her by MR on 5/4 when she was seen.  She stated that she reviewed her results on mychart and was concerned about the cxr.  She stated that she is not having nearly as many issues as she was on 5/4, but she stated that her breathing is still not back to normal and she wakes up during the night with SOB.  SN please advise. Thanks  / Allergies  Allergen Reactions  . Codeine     REACTION: nausea

## 2017-05-02 NOTE — Telephone Encounter (Signed)
Per SN---  Sn reviewed MR note and the cxr---pt was given the zpak and pred taper.  The pts that we have seen, it has taken a while to totally resolve with this type of illness.   Basilar atelectasis on the cxr ---needs to take deep breaths and clear out sputum.  F/U with SN in 2-3 weeks for eval and cxr.    Called and spoke with pt about about SN recs---she will be going out of town for a week and was concerned about still feeling like this while she is out of town.  Pt was concerned that we still have not received the labs from her GYN.  I have called and requested that these be faxed to [place] so SN may review these.  Pt is aware that we will call her back.

## 2017-05-08 NOTE — Telephone Encounter (Signed)
LM x 1 for pt 

## 2017-05-08 NOTE — Telephone Encounter (Signed)
I have still not received these labs from her doctor.  She may want to call and have these faxed again.  I have called and requested that they be faxed over last week as well.  thanks

## 2017-05-08 NOTE — Telephone Encounter (Signed)
Leigh have you received the labs

## 2017-05-09 NOTE — Telephone Encounter (Signed)
Called and spoke with pt and she is aware that we have not received the labs from Dr. Gertie Fey.  She is still having the congestion and she is very upset that we have not contacted her back about not getting these labs.  Pt stated that she has a follow up with Dr. Gertie Fey the first week of June and she stated that she will see this doctor and go from there.  SN is aware.

## 2017-05-19 DIAGNOSIS — D72829 Elevated white blood cell count, unspecified: Secondary | ICD-10-CM | POA: Diagnosis not present

## 2017-05-25 ENCOUNTER — Telehealth: Payer: Self-pay | Admitting: Internal Medicine

## 2017-05-25 NOTE — Telephone Encounter (Signed)
Wanted to see if she had asthma but she could nto do feno. Then wanted to see if her eos were high so queried cbc done 04/19/17 at pcp office but in this hgb 12gm% - no diff done  Dr. Brand Males, M.D., [facility].C.P Pulmonary and Critical Care Medicine Staff Physician [place] [facility] Pager: [phone], If no answer or between  15:00h - 7:00h: call [phone]  05/25/2017 2:26 AM

## 2017-06-06 DIAGNOSIS — Z1231 Encounter for screening mammogram for malignant neoplasm of breast: Secondary | ICD-10-CM | POA: Diagnosis not present

## 2017-06-12 ENCOUNTER — Ambulatory Visit: Payer: Commercial Managed Care - HMO | Admitting: Pulmonary Disease

## 2017-08-16 ENCOUNTER — Encounter: Payer: Self-pay | Admitting: Genetic Counselor

## 2017-08-16 ENCOUNTER — Ambulatory Visit (HOSPITAL_BASED_OUTPATIENT_CLINIC_OR_DEPARTMENT_OTHER): Payer: Commercial Managed Care - HMO | Admitting: Genetic Counselor

## 2017-08-16 ENCOUNTER — Other Ambulatory Visit: Payer: Commercial Managed Care - HMO

## 2017-08-16 DIAGNOSIS — Z808 Family history of malignant neoplasm of other organs or systems: Secondary | ICD-10-CM

## 2017-08-16 DIAGNOSIS — Z800 Family history of malignant neoplasm of digestive organs: Secondary | ICD-10-CM

## 2017-08-16 DIAGNOSIS — K635 Polyp of colon: Secondary | ICD-10-CM | POA: Diagnosis not present

## 2017-08-16 DIAGNOSIS — Z860100 Personal history of colon polyps, unspecified: Secondary | ICD-10-CM

## 2017-08-16 DIAGNOSIS — Z7183 Encounter for nonprocreative genetic counseling: Secondary | ICD-10-CM | POA: Diagnosis not present

## 2017-08-16 DIAGNOSIS — Z803 Family history of malignant neoplasm of breast: Secondary | ICD-10-CM | POA: Diagnosis not present

## 2017-08-16 NOTE — Progress Notes (Signed)
REFERRING PROVIDER: Everlene Farrier, [address] [place], [place] [address]  PRIMARY PROVIDER:  Noralee Space, MD  PRIMARY REASON FOR VISIT:  1. Personal history of colonic polyps   2. Family history of breast cancer   3. Family history of pancreatic cancer   4. Family history of colon cancer   5. Family history of thyroid cancer      HISTORY OF PRESENT ILLNESS:   [name], a 51 y.o. female, was seen for a [place] cancer genetics consultation at the request of Dr. Gaetano Net due to a family history of cancer.  Kylie Miller presents to clinic today to discuss the possibility of a hereditary predisposition to cancer, genetic testing, and to further clarify her future cancer risks, as well as potential cancer risks for family members. [name] is a 51 y.o. female with no personal history of cancer.  She has multiple family members with different forms of cancer.  CANCER HISTORY:   No history exists.     HORMONAL RISK FACTORS:  Menarche was at age 78.  First live birth at age 41.  OCP use for approximately 11 years.  Ovaries intact: yes.  Hysterectomy: yes.  Menopausal status: postmenopausal.  HRT use: 0 years. Colonoscopy: yes; a few colon polyps. Mammogram within the last year: yes. Number of breast biopsies: 0. Up to date with pelvic exams:  yes. Any excessive radiation exposure in the past:  no  Past Medical History:  Diagnosis Date  . Allergic rhinitis   . Anxiety   . Family history of breast cancer   . Family history of colon cancer   . Family history of pancreatic cancer   . Family history of thyroid cancer   . GERD (gastroesophageal reflux disease)   . Hyperlipidemia   . IBS (irritable bowel syndrome)   . Lumbar back pain     Past Surgical History:  Procedure Laterality Date  . CESAREAN SECTION    . VAGINAL HYSTERECTOMY      Social History   Social History  . Marital status: Married    Spouse name: david   . Number of children: 2    . Years of education: N/A   Occupational History  . [employer]   Social History Main Topics  . Smoking status: Never Smoker  . Smokeless tobacco: Never Used  . Alcohol use Yes     Comment: 1 or less  . Drug use: No  . Sexual activity: Not Asked   Other Topics Concern  . None   Social History Narrative  . None     FAMILY HISTORY:  We obtained a detailed, 4-generation family history.  Significant diagnoses are listed below: Family History  Problem Relation Age of Onset  . Breast cancer Mother 69  . Pancreatic cancer Mother 21  . Skin cancer Father   . Colon cancer Paternal Uncle        dx late 60s-70  . Colon cancer Maternal Aunt        dx between 55-70  . Heart disease Maternal Grandfather   . Stomach cancer Paternal Grandfather 51  . Thyroid cancer Maternal Grandmother        dx in her 29s  . Heart disease Paternal Aunt   . Esophageal cancer Paternal Grandmother 89  . Heart attack Paternal Grandmother 89  . Cancer Other        PGF's brother with unknown cancer  . Cancer  Other        MGM's brother with unknown cancer    The patient has two sons who are cancer free. She has one sister who has not had cancer, and she has a son who is cancer free.  The patient's mother is deceased and her father is living.  The patient's mother developed breast cancer at 56 and pancreatic cancer at 84.  Her mother had one sister and two brothers.  The sister had colon cancer in her 22's and died of heart disease.  Both brothers are living and cancer free.  The maternal grandparents are deceased.  The grandfather died of heart disease and the grandmother had thyroid cancer, unknown pathology.  The grandmother's brother died of an unknown cancer.  The patient's father has had multiple skin cancers removed. He had two brothers and one sister.  One brother and his sister died of heart disease.  The other brother developed colon cancer in his 59's-70's.  Both  paternal grandparents are deceased.  The grandmother had esophageal cancer at 42 and died of a heart attack, and the grandfather developed stomach cancer at 53 and died by 69.  He had multiple siblings, one who had an unknown cancer.  Kylie Miller is unaware of previous family history of genetic testing for hereditary cancer risks. Patient's maternal ancestors are of Caucasian descent, and paternal ancestors are of Caucasian descent. There is no reported Ashkenazi Jewish ancestry. There is no known consanguinity.  GENETIC COUNSELING ASSESSMENT: Kylie Miller is a 51 y.o. female with a family history of breast, pancreatic, colon and stomach cancer which is somewhat suggestive of a hereditary cancer syndrome and predisposition to cancer. We, therefore, discussed and recommended the following at today's visit.   DISCUSSION: We discussed that about 5-10% of cancer is hereditary.  About 5-6% of colon cancer is hereditary with about 5-10% of breast cancer being hereditary.  Based on the cancer in her family, we discussed cancer due to BRCA mutations, as well as those due to [facility] and MEN1/2.  We discussed that her grandmother had thyroid cancer, and while most thyroid cancer has a histology of papillary which is typically not hereditary, some cases such as follicular and medullary can be more heritable.  We reviewed the characteristics, features and inheritance patterns of hereditary cancer syndromes. We also discussed genetic testing, including the appropriate family members to test, the process of testing, insurance coverage and turn-around-time for results. We discussed the implications of a negative, positive and/or variant of uncertain significant result. We recommended Ms. Cantave pursue genetic testing for the Multi- gene panel. The Multi-Gene Panel offered by [facility] includes sequencing and/or deletion duplication testing of the following 83 genes: ALK, APC, ATM, AXIN2,BAP1,  BARD1, BLM, BMPR1A, BRCA1, BRCA2,  BRIP1, CASR, CDC73, CDH1, CDK4, CDKN1B, CDKN1C, CDKN2A (p14ARF), CDKN2A (p16INK4a), CEBPA, CHEK2, CTNNA1, DICER1, DIS3L2, EGFR (c.2369C>T, p.Thr790Met variant only), EPCAM (Deletion/duplication testing only), FH, FLCN, GATA2, GPC3, GREM1 (Promoter region deletion/duplication testing only), HOXB13 (c.251G>A, p.Gly84Glu), HRAS, KIT, MAX, MEN1, MET, MITF (c.952G>A, p.Glu318Lys variant only), MLH1, MSH2, MSH3, MSH6, MUTYH, NBN, NF1, NF2, NTHL1, PALB2, PDGFRA, PHOX2B, PMS2, POLD1, POLE, POT1, PRKAR1A, PTCH1, PTEN, RAD50, RAD51C, RAD51D, RB1, RECQL4, RET, RUNX1, SDHAF2, SDHA (sequence changes only), SDHB, SDHC, SDHD, SMAD4, SMARCA4, SMARCB1, SMARCE1, STK11, SUFU, TERT, TERT, TMEM127, TP53, TSC1, TSC2, VHL, WRN and WT1.    Based on Ms. Guderian's personal and family history of cancer, she is on the cusp of meeting medical criteria for genetic testing. We discussed that  her family history meets NCCN guidelines, but many times insurance has not updated their guidelines at this time.  We discussed that she may still have an out of pocket cost. We discussed that if her out of pocket cost for testing is over $100, the laboratory will call and confirm whether she wants to proceed with testing.  If the out of pocket cost of testing is less than $100 she will be billed by the genetic testing laboratory.   We discussed that some people do not want to undergo genetic testing due to fear of genetic discrimination.  A federal law called the Genetic Information Non-Discrimination Act (GINA) of 2008 helps protect individuals against genetic discrimination based on their genetic test results.  It impacts both health insurance and employment.  With health insurance, it protects against increased premiums, being kicked off insurance or being forced to take a test in order to be insured.  For employment it protects against hiring, firing and promoting decisions based on genetic test results.  Health status due to a cancer diagnosis is not  protected under GINA.   In order to estimate her chance of having a BRCA mutation, we used statistical models (PENN II and Tyrer Cusik) and laboratory data that take into account her personal medical history, family history and ancestry.  Because each model is different, there can be a lot of variability in the risks they give.  Therefore, these numbers must be considered a rough range and not a precise risk of having a BRCA mutation.  These models estimate that she has approximately a 0.35-6% chance of having a mutation.   Based on the patient's personal and family history, statistical models (Tyrer Cusik)  and literature data were used to estimate her risk of developing breast cancer. This estimates her lifetime risk of developing breast cancer to be approximately 16.8%. This estimation does not take into account any genetic testing results.  The patient's lifetime breast cancer risk is a preliminary estimate based on available information using one of several models endorsed by the [place] (ACS). The ACS recommends consideration of breast MRI screening as an adjunct to mammography for patients at high risk (defined as 20% or greater lifetime risk). A more detailed breast cancer risk assessment can be considered, if clinically indicated.       PLAN: After considering the risks, benefits, and limitations, Ms. Oguin  provided informed consent to pursue genetic testing and the blood sample was sent to [facility] for analysis of the Multi-gene cancer panel. Results should be available within approximately 2-3 weeks' time, at which point they will be disclosed by telephone to Ms. Tornow, as will any additional recommendations warranted by these results. Ms. Durfee will receive a summary of her genetic counseling visit and a copy of her results once available. This information will also be available in Epic. We encouraged Ms. Tineo to remain in contact with cancer genetics annually so  that we can continuously update the family history and inform her of any changes in cancer genetics and testing that may be of benefit for her family. Ms. Novitski questions were answered to her satisfaction today. Our contact information was provided should additional questions or concerns arise.  Lastly, we encouraged Ms. Filippini to remain in contact with cancer genetics annually so that we can continuously update the family history and inform her of any changes in cancer genetics and testing that may be of benefit for this family.   Ms.  Schlag  questions were answered to her satisfaction today. Our contact information was provided should additional questions or concerns arise. Thank you for the referral and allowing [place] to share in the care of your patient.   Emilia Kayes P. Florene Glen, [place], [facility] Certified Genetic Counselor [email or web address] phone: [phone]  The patient was seen for a total of 65 minutes in face-to-face genetic counseling.  This patient was discussed with Drs. Magrinat, Lindi Adie and/or Burr Medico who agrees with the above.    _______________________________________________________________________ For Office Staff:  Number of people involved in session: 1 Was an Intern/ student involved with case: no

## 2017-09-07 ENCOUNTER — Other Ambulatory Visit (INDEPENDENT_AMBULATORY_CARE_PROVIDER_SITE_OTHER): Payer: 59

## 2017-09-07 ENCOUNTER — Ambulatory Visit (INDEPENDENT_AMBULATORY_CARE_PROVIDER_SITE_OTHER): Payer: 59 | Admitting: Pulmonary Disease

## 2017-09-07 ENCOUNTER — Encounter: Payer: Self-pay | Admitting: Pulmonary Disease

## 2017-09-07 VITALS — BP 108/64 | HR 57 | Temp 97.9°F | Ht 66.5 in | Wt 151.0 lb

## 2017-09-07 DIAGNOSIS — J309 Allergic rhinitis, unspecified: Secondary | ICD-10-CM

## 2017-09-07 DIAGNOSIS — F411 Generalized anxiety disorder: Secondary | ICD-10-CM

## 2017-09-07 DIAGNOSIS — Z0000 Encounter for general adult medical examination without abnormal findings: Secondary | ICD-10-CM

## 2017-09-07 DIAGNOSIS — Z860100 Personal history of colon polyps, unspecified: Secondary | ICD-10-CM

## 2017-09-07 DIAGNOSIS — K589 Irritable bowel syndrome without diarrhea: Secondary | ICD-10-CM

## 2017-09-07 DIAGNOSIS — K219 Gastro-esophageal reflux disease without esophagitis: Secondary | ICD-10-CM

## 2017-09-07 DIAGNOSIS — E7800 Pure hypercholesterolemia, unspecified: Secondary | ICD-10-CM

## 2017-09-07 DIAGNOSIS — Z23 Encounter for immunization: Secondary | ICD-10-CM

## 2017-09-07 LAB — TSH: TSH: 4.16 u[IU]/mL (ref 0.35–4.50)

## 2017-09-07 LAB — LIPID PANEL
Cholesterol: 195 mg/dL (ref 0–200)
HDL: 100.3 mg/dL (ref >39.00)
LDL Cholesterol: 83 mg/dL (ref 0–99)
NonHDL: 94.74
Total CHOL/HDL Ratio: 2
Triglycerides: 57 mg/dL (ref 0.0–149.0)
VLDL: 11.4 mg/dL (ref 0.0–40.0)

## 2017-09-07 LAB — COMPREHENSIVE METABOLIC PANEL
ALBUMIN: 4.5 g/dL (ref 3.5–5.2)
ALT: 21 U/L (ref 0–35)
AST: 25 U/L (ref 0–37)
Alkaline Phosphatase: 53 U/L (ref 39–117)
BILIRUBIN TOTAL: 0.7 mg/dL (ref 0.2–1.2)
BUN: 25 mg/dL — AB (ref 6–23)
CO2: 30 mEq/L (ref 19–32)
CREATININE: 0.74 mg/dL (ref 0.40–1.20)
Calcium: 9.9 mg/dL (ref 8.4–10.5)
Chloride: 104 mEq/L (ref 96–112)
GFR: 87.23 mL/min (ref >60.00)
Glucose, Bld: 93 mg/dL (ref 70–99)
Potassium: 4.2 mEq/L (ref 3.5–5.1)
SODIUM: 140 meq/L (ref 135–145)
TOTAL PROTEIN: 7.5 g/dL (ref 6.0–8.3)

## 2017-09-07 LAB — CBC WITH DIFFERENTIAL/PLATELET
BASOS ABS: 0 10*3/uL (ref 0.0–0.1)
Basophils Relative: 1.1 % (ref 0.0–3.0)
EOS PCT: 1.7 % (ref 0.0–5.0)
Eosinophils Absolute: 0.1 10*3/uL (ref 0.0–0.7)
HEMATOCRIT: 37.1 % (ref 36.0–46.0)
Hemoglobin: 12.4 g/dL (ref 12.0–15.0)
LYMPHS ABS: 1.8 10*3/uL (ref 0.7–4.0)
LYMPHS PCT: 38.5 % (ref 12.0–46.0)
MCHC: 33.5 g/dL (ref 30.0–36.0)
MCV: 92.4 fl (ref 78.0–100.0)
Monocytes Absolute: 0.5 10*3/uL (ref 0.1–1.0)
Monocytes Relative: 10.3 % (ref 3.0–12.0)
Neutro Abs: 2.2 10*3/uL (ref 1.4–7.7)
Neutrophils Relative %: 48.4 % (ref 43.0–77.0)
Platelets: 322 10*3/uL (ref 150.0–400.0)
RBC: 4.02 Mil/uL (ref 3.87–5.11)
RDW: 13.4 % (ref 11.5–15.5)
WBC: 4.6 10*3/uL (ref 4.0–10.5)

## 2017-09-07 LAB — VITAMIN D 25 HYDROXY (VIT D DEFICIENCY, FRACTURES): VITD: 56.97 ng/mL (ref 30.00–100.00)

## 2017-09-07 MED ORDER — SIMVASTATIN 20 MG PO TABS: 20.0000 mg | ORAL_TABLET | Freq: Every evening | ORAL | 3 refills | Status: DC

## 2017-09-07 NOTE — Progress Notes (Signed)
Subjective:     Patient ID: Kylie Miller, female   DOB: 1966-03-13, 51 y.o.   MRN: [id number]  HPI 51 y/o WF here for a follow up visit and CPX...   ~  [name]:  she was last seen [name] doing well... we decided to start Lipitor 51m/d for her Chol with a great response, but she has since discontinued this med in favor of diet alone... we did f/u FLP w/ TChol 227, LDL 141- started Simva20... ~  October 09, 2009:  she's had a good 122motolerating Simva20 & working well... had plantar fasciitis w/ eval by [facility]- stretching, PT, NSAIDs Prn... no new complaints or concerns... ~  February 11, 2011:  here for CPX & 1634moV doing well w/o new complaints or concerns... she does c/o some incr gas & we discussed antigas rx w/ simethacone, etc... she continues to do well on the Simva20...  ~  June 13, 2012:  73m77mo & CPX> AngeDevonna had a good yr- feeling well & no new complaints or concerns...    CHOL> on Simva20 & f/u FLP looks good, all parameters at goals (see below); continue same...    GI> GERD, IBS, Polyp> still notes some gas problems but improved on Simethacone preps; she saw DrPyrtle for Colonoscopy 3/13 w/ one 7mm 19merplastic polyp removed, sm int hems seen, f/u planned 10 yrs... We reviewed prob list, meds, xrays and labs> see below>>  LABS 6/13:  FLP- allparameters at goals on Simva20;  Chems- wnl;  CBC- wnl;  TSH=2.37;  UA- clear   ~  August 07, 2013:  39mo 62mo CPX> AngelaAlixandriats a good year- no new complaints or concerns...     AR> on Zyrtek OTC as needed...    CHOL> on Simva20 & f/u FLP looks good, all parameters at goals (see below); continue same...    GI> GERD, IBS, Polyp> still notes some gas problems but improved on Simethacone preps; she saw DrPyrtle for Colonoscopy 3/13 w/ one 7mm hy32mplastic polyp removed, sm int hems seen, f/u planned 10 yrs... We reviewed prob list, meds, xrays and labs> see below for updates >>   CXR 8/14 showed norm heart size, clear lungs,  NAD...  LABS 8/14:  FLP- at goals on Simva20;  Chems- wnl;  LFTs= wnl;  CBC- ok w/ Hg=12.2 MCV=92;  TSH=2.49...   ~  September 05, 2014:  Yearly ROV & CPX>  Kylie Ruthias a good yr- no new complaints or concerns, she & husb have started running & they competed in a 1/2Marathon... We reviewed the following medical problems during today's office visit >>     AR> on Zyrtek OTC as needed...    CHOL> on Simva20 & f/u FLP looks good, all parameters at goals (see below); continue same...    GI> GERD, IBS, Polyp> still notes some gas problems but improved on Simethacone preps prn; she saw DrPyrtle for Colonoscopy 3/13 w/ one 7mm hyp75mlastic polyp removed, sm int hems seen, f/u planned 10 yrs... We reviewed prob list, meds, xrays and labs> see below for updates >> OK 2015 Flu shot today  LABS 9/15:  FLP- ok x LDL=111;  Chems- wnl;  CBC- wnl;  TSH=3.46...  ~  September 08, 2015:  Yearly ROV & CPX> Kylie nDamiaeveral issues over the last yr- she had been training & running half-marathons w/ her husb but started having some right hip pain; she fell in May & [facility]  w/ L4-5 disc degen reported; they also saw a spot on her kidney & she was sent to ?[facility] but their eval indicated that ?one kidney moved & no spot was identified (per pt's recollection- we do not have notes from them);  Now she is c/o occas waking up at night w/ back pain & her exercise has been lim to yoga and elliptical machine per ortho... We reviewed the following medical problems during today's office visit >>     AR> on Zyrtek OTC as needed...    CHOL> on Simva20 & f/u FLP looks good, all parameters at goals (see below); continue same...    GI> GERD, IBS, Polyp> still notes some gas problems but improved on Simethacone preps prn; she saw DrPyrtle for Colonoscopy 3/13 w/ one 658m hyperplastic polyp removed, sm int hems seen, f/u planned 10 yrs...    GU> ?spot on her kidney per ortho XRays? She was  eval by Urology- nothing found by her report; we have requested records to be sent...    GYN> followed by DrTomblin, everything is ok per pt; she says she had a baseline BMD from them & it was OK...     Ortho> c/o back pain & right hip pain> as above- she had ortho eval DrBeane, we have requested records to be sent... We reviewed prob list, meds, xrays and labs> see below for updates >> OK 2016 Flu shot today & Rx written for shingles vaccine...  LABS 9/16:  FLP- at goals on Simva20;  Chems- wnl;  CBC- wnl w/ Hg=12.4;  TSH= 3/92 IMP/PLAN>>  Good general health, don't know what to make of the back/right hip discomfort/ ortho eval/ urology eval and we will request records to review...   ~  September 07, 2016:  Yearly ROV & CPX... 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 We reviewed the following medical problems during today's office visit >>     AR> on Zyrtek OTC as needed...    CHOL> on Simva20 & f/u FLP looks good, all parameters at goals (see below); continue same...    GI> GERD, IBS, Polyp> still notes some gas problems but improved on Simethacone preps prn; she saw DrPyrtle for Colonoscopy 3/13 w/ one 58m34myperplastic polyp removed, sm int hems seen, f/u planned 10 yrs...    GU> ?spot on her kidney per ortho XRays? She was eval by Urology- nothing found by her report; we have requested records to be sent...    GYN> followed by DrTomblin, everything is ok per pt; she says she had a baseline BMD from them & it was OK...     Ortho> c/o back pain & right hip pain> as above- she had ortho eval DrBeane, we have requested records to be sent... EXAM shows Afeb, VSS, O2sat=98% on  RA at rest;  HEENT- neg, mallampati2;  Chest- clear w/o w/r/r;  Heart- RR w/o m/r/g;  Abd- soft, nontender, neg;  Ext- neg w/o c/c/e;  Neuro- intact...  CXR 09/07/16>  Norm heart size, clear lungs, NAD, mild dengen changes in Tspine...  XRays Cspine 09/07/16>  No fx or dislocation, mild degenerative changes w/ disc  sp flattening at C5-6 level & mild spurring...  EKG 09/07/16>  SBrady, rate58, wnl...  LABS 09/07/16>  FLP- all parameters at goals on Simva20;  Chems- wnl;  CBC- wnl;  TSH=4.83 borderline elev;  VitD=50... IMP/PLAN>>  Eular is under some stress w/ work- advised on Alpraz dosing;  She will try rest, heat, topical meds, Aleve as needed for neck + KT strips etc;  Given 2018 FLU vaccine...   ~  September 07, 2017:  Yearly ROV & CPX>  Janace Hoard has retired from [employer] & she & Shanon Brow are living the dream- helping kids and grands, going to their [place], & traveling... Medically speaking she is doing extremely well without complaints or concerns;  She notes that in [id number] she was exposed to a lot of pollen w/ congestion/ drainage/ cough=> given ZPak & Pred & resolved quickly;  She has additionally noted back discomfort=> did PT exercises on her own & yoga w/ improvement... We reviewed the following medical problems during today's office visit>      AR> on Zyrtek OTC as needed, rec to use Flonase qhs as well...    CHOL> on Simva20 & f/u FLP 9/18 looks good, all parameters at goals (see below); continue same...    GI> GERD, IBS, Polyp> still notes some gas problems but improved on Simethacone preps prn; she saw DrPyrtle for Colonoscopy 3/13 w/ one 44m hyperplastic polyp removed, sm int hems seen, f/u planned 10 yrs...    GU> ?spot on her kidney per ortho XRays? 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     GYN> followed by DrTomblin, everything is ok per pt; she says she had a baseline BMD from them & it was OK...    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    Ortho> c/o back pain & right hip pain> as above- she had ortho eval DrBeane, we have requested records to be sent... EXAM shows Afeb, VSS, O2sat=100% on RA at rest;  HEENT- neg, mallampati2;  Chest- clear w/o w/r/r;  Heart- RR w/o m/r/g;  Abd- soft, nontender, neg;  Ext- neg w/o c/c/e;  Neuro- intact...  LABS 09/07/17>  FLP- at goals on Simva20 + diet;  Chems- wnl;  CBC- ok w/ Hg=12.4, WBC=4.6, eos=100;  TSH=4.16;  VitD=57 IMP/PLAN>>  AEmmienjoys excellent general medical health; she has mild GAD- rx w/ Alprazolam0.545mprn; we gave her the 2018 FLU vaccine; we plan roc/ cpx in 1y46yr          Problem List:    PHYSICAL EXAMINATION (ICD-V70.0) - GYN = DrTomblin for Pap, Mammogram, BMD, etc... she is up to date w/ her check ups... ~  CXR 10/10 showed normal heart size, clear lungs, NAD... ~  EKG 2/12 showed NSR, rate67, WNL... Marland Kitchen  CXR 8/14 showed norm heart size, clear lungs, NAD...  ALLERGIC RHINITIS (ICD-477.9) - OTC Claritin/ Zyrtek as needed.  HYPERCHOLESTEROLEMIA (ICD-272.0) - on SIMVASTATIN 13m2m+ low chol diet... ~  prev FLP's w/ TChol 230-240, TG 60's, HDL 68-70, LDL 160 range. ~  FLP on statin Rx showed TChol 150-170, TG 3-60, HDL 61-67, LDL 83-92 ~  FLP 4/09 showed TChol 227, TG 58, HDL 65, LDL 141... rec> start SIMVA20. ~  FLP [place]09 on Simva20 showed TChol 156,  TG 49, HDL 66, LDL 80 ~  FLP 10/10 on Simva20 showed TChol 161, TG 67, HDL 59, LDL 89 ~  FLP 2/12 on Simva20 showed TChol 198, TG 102, HDL 70, LDL 107... we reviewed diet/ exercise. ~  FLP 6/13 on Simva20 showed TChol 179, TG 66, HDL 76, LDL 90 ~  FLP 8/14 on Simva20 showed TChol 174, TG 51, HDL 74, LDL 90 ~  FLP 9/15 on Simva20 showed TChol 191, TG 36, HDL 73, LDL 111... We reviewed diet & exercise. ~  FLP  9/16 on Simva20 showed TChol 169, TG 68, HDL 72, LDL 83 ~  FLP 9/17 on simva20 showed TChol 199, TG 93, HDL 80, LDL 101 ~  FLP 9/18 on Simva20 showed TChol 195, TG 57, HDL 100, LDL 83  GERD >> no recent problems... prev used Prilosec vs Ranitadine...  IRRITABLE BOWEL SYNDROME COLON POLYP >> 58m dec colon polyp removed 2/13= hyperplastic. INTERNAL HEMORRHOIDS ~  She has had complaints of gas & treated w/ OTC Simethacone preps... ~  2/13:  GI eval DrPyrtle for change in bowel habits w/ freq & intermittent blood seen> neg exam, normal labs including Tissue Transglutaminase & IgA level (neg for Celiac dis)... ~  Colonoscopy 2/13 by DrPyrtle 2/13 showed one 738mpolyp in desc colon- hyperplastic, sm int hems... F/u planned 10 yrs.  Family Hx of Pancreatic Cancer >>  ~  the patient's mother had a history of pancreatic cancer and she discussed this w/ DrPyrtle 2/13;  They reviewed how there are no guidelines currently supporting screening in asymptomatic individuals with a family history of pancreatic cancer (this being based on the fact that there is no known family history of hereditary pancreatitis or known hereditary/genetic cancer syndromes)...  Right hip pain w/ running> reported this in 2016, she's had Ortho eval by DrBeane but we do not have his records... BACK PAIN, LUMBAR (ICD-724.2) - hx lumbar disc dis, no surgery... developed plantar fasciitis 2010 w/ eval by [facility]- stretching, PT, NSAIDs Prn... ~  9/16> she reported right hip pain & back discomfort; [facility] degen discs & exercise restricted to yoga & elliptical  ANXIETY (ICD-300.00)   Past Surgical History:  Procedure Laterality Date  . CESAREAN SECTION    . VAGINAL HYSTERECTOMY      Outpatient Encounter Prescriptions as of 09/07/2017  Medication Sig  . ALPRAZolam (XANAX) 0.5 MG tablet Take 1 tablet (0.5 mg total) by mouth every 6 (six) hours as needed for anxiety.  . calcium carbonate  (OS-CAL) 1250 (500 Ca) MG chewable tablet Chew 1 tablet by mouth daily.  . Marland Kitchenlucosamine-chondroitin 500-400 MG tablet Take 1 tablet by mouth at bedtime.  . Multiple Vitamins-Minerals (MULTIVITAMIN WITH MINERALS) tablet Take 1 tablet by mouth daily.  . simvastatin (ZOCOR) 20 MG tablet Take 1 tablet (20 mg total) by mouth every evening.  . [DISCONTINUED] simvastatin (ZOCOR) 20 MG tablet Take 1 tablet (20 mg total) by mouth every evening.  . [DISCONTINUED] azithromycin (ZITHROMAX) 250 MG tablet Take as directed (Patient not taking: Reported on 09/07/2017)  . [DISCONTINUED] predniSONE (DELTASONE) 10 MG tablet 40 mg x1 day, then 30 mg x1 day, then 20 mg x1 day, then 10 mg x1 day, and then 5 mg x1 day and stop (Patient not taking: Reported on 09/07/2017)   No facility-administered encounter medications on file as of 09/07/2017.     Allergies  Allergen Reactions  . Codeine     REACTION: nausea  Immunization History  Administered Date(s) Administered  . Influenza Split 09/19/2011, 10/07/2012  . Influenza,inj,Quad PF,6+ Mos 09/05/2014, 09/08/2015, 09/07/2016  . Tdap 03/29/2013  . Zoster 03/07/2016    Current Medications, Allergies, Past Medical History, Past Surgical History, Family History, and Social History were reviewed in [employer] record.   Review of Systems        The patient complains of gas/bloating.  The patient denies fever, chills, sweats, anorexia, fatigue, weakness, malaise, weight loss, sleep disorder, blurring, diplopia, eye irritation, eye discharge, vision loss, eye pain, photophobia, earache, ear discharge, tinnitus, decreased hearing, nasal congestion, nosebleeds, sore throat, hoarseness, chest pain, palpitations, syncope, dyspnea on exertion, orthopnea, PND, peripheral edema, cough, dyspnea at rest, excessive sputum, hemoptysis, wheezing, pleurisy, nausea, vomiting, diarrhea, constipation, change in bowel habits, abdominal pain, melena, hematochezia,  jaundice, indigestion/heartburn, dysphagia, odynophagia, dysuria, hematuria, urinary frequency, urinary hesitancy, nocturia, incontinence, back pain, joint pain, joint swelling, muscle cramps, muscle weakness, stiffness, arthritis, sciatica, restless legs, leg pain at night, leg pain with exertion, rash, itching, dryness, suspicious lesions, paralysis, paresthesias, seizures, tremors, vertigo, transient blindness, frequent falls, frequent headaches, difficulty walking, depression, anxiety, memory loss, confusion, cold intolerance, heat intolerance, polydipsia, polyphagia, polyuria, unusual weight change, abnormal bruising, bleeding, enlarged lymph nodes, urticaria, allergic rash, hay fever, and recurrent infections.     Objective:   Physical Exam     WD, WN, 51 y/o WF in NAD... GENERAL:  Alert & oriented; pleasant & cooperative... HEENT:  [place]/AT, EOM-wnl, PERRLA, EACs-clear, TMs-wnl, NOSE-clear, THROAT-clear & wnl. NECK:  Supple w/ full ROM; no JVD; normal carotid impulses w/o bruits; no thyromegaly or nodules palpated; no lymphadenopathy. CHEST:  Clear to P & A; without wheezes/ rales/ or rhonchi. HEART:  Regular Rhythm; without murmurs/ rubs/ or gallops. ABDOMEN:  Soft & nontender; normal bowel sounds; no organomegaly or masses detected. EXT: without deformities or arthritic changes; no varicose veins/ venous insuffic/ or edema. NEURO:  CN's intact; motor testing normal; sensory testing normal; gait normal & balance OK. DERM:  No lesions noted; no rash etc...  RADIOLOGY DATA:  Reviewed in the EPIC EMR & discussed w/ the patient...  LABORATORY DATA:  Reviewed in the EPIC EMR & discussed w/ the patient...   Assessment:     CPX>>   ~  09/07/16>  Jaeliana is under some stress w/ work- advised on Alpraz dosing;  She will try rest, heat, topical meds, Aleve as needed for neck + KT strips etc;  Given 2018 FLU vaccine ~  09/07/17>   Yahira enjoys excellent general medical health; she has mild GAD- rx  w/ Alprazolam0.40m prn; we gave her the 2018 FLU vaccine; we plan roc/ cpx in 158yr AR>  She uses OTC antihist as needed...  CHOL>  Stable on Simva20 & FLP looks good on this dose, tol well, etc...  GERD>  No recent symptoms and she ises OTC PPI vs H2blockers as needed...  IBS, Polyp, Hem>  She had GI eval & colonoscopy 2/13 by DrPyrtle; colon revealed one hyperplastic polyp; symptoms have subsided since them- w/o incr stool freq or recurrent blood...  GU>  Sent to Urology by Ortho w/ ?spot on her kidney; she reports eval was neg, nothing found & we are awaiting ecods to review...  Ortho>  She [facility] pain issues [facility] & exercise has been lim to yoga 7 elliptical; we will request records to review...      Plan:     Patient's Medications  New Prescriptions  No medications on file  Previous Medications   ALPRAZOLAM (XANAX) 0.5 MG TABLET    Take 1 tablet (0.5 mg total) by mouth every 6 (six) hours as needed for anxiety.   CALCIUM CARBONATE (OS-CAL) 1250 (500 CA) MG CHEWABLE TABLET    Chew 1 tablet by mouth daily.   GLUCOSAMINE-CHONDROITIN 500-400 MG TABLET    Take 1 tablet by mouth at bedtime.   MULTIPLE VITAMINS-MINERALS (MULTIVITAMIN WITH MINERALS) TABLET    Take 1 tablet by mouth daily.  Modified Medications   Modified Medication Previous Medication   SIMVASTATIN (ZOCOR) 20 MG TABLET simvastatin (ZOCOR) 20 MG tablet      Take 1 tablet (20 mg total) by mouth every evening.    Take 1 tablet (20 mg total) by mouth every evening.  Discontinued Medications   AZITHROMYCIN (ZITHROMAX) 250 MG TABLET    Take as directed   PREDNISONE (DELTASONE) 10 MG TABLET    40 mg x1 day, then 30 mg x1 day, then 20 mg x1 day, then 10 mg x1 day, and then 5 mg x1 day and stop

## 2017-09-07 NOTE — Patient Instructions (Signed)
Today we updated your med list in our EPIC system...    Continue your current medications the same...  Today we did your follow up FASTING blood work...    We will contact you w/ the results when available...   Keep up the good work w/ diet 7 exercise...  Call for any questions...  Let's plan a follow up visit in 19yr, sooner if needed for problems.Marland KitchenMarland Kitchen

## 2017-09-21 ENCOUNTER — Telehealth: Payer: Self-pay | Admitting: Genetic Counselor

## 2017-09-21 NOTE — Telephone Encounter (Signed)
Called patient b/c I had heard from lab that her OOP cost was going to be higher than expected and they needed to know how to proceed.  The patient reports that she spoke with the lab a couple days ago and that they told her they were still working with her insurance.  I sent the lab an email asking more about this.

## 2017-10-10 ENCOUNTER — Encounter: Payer: Self-pay | Admitting: Genetic Counselor

## 2017-10-10 ENCOUNTER — Ambulatory Visit: Payer: Self-pay | Admitting: Genetic Counselor

## 2017-10-10 DIAGNOSIS — Z800 Family history of malignant neoplasm of digestive organs: Secondary | ICD-10-CM

## 2017-10-10 DIAGNOSIS — Z803 Family history of malignant neoplasm of breast: Secondary | ICD-10-CM

## 2017-10-10 DIAGNOSIS — Z1379 Encounter for other screening for genetic and chromosomal anomalies: Secondary | ICD-10-CM | POA: Insufficient documentation

## 2017-10-10 DIAGNOSIS — Z808 Family history of malignant neoplasm of other organs or systems: Secondary | ICD-10-CM

## 2017-10-10 NOTE — Progress Notes (Signed)
HPI: Kylie Miller Miller was previously seen in the [facility] due to a family history of cancer and concerns regarding a hereditary predisposition to cancer. Please refer to our prior cancer genetics clinic note for more information regarding Kylie Miller Miller's medical, social and family histories, and our assessment and recommendations, at the time. Kylie Miller Miller recent genetic test results were disclosed to her, as were recommendations warranted by these results. These results and recommendations are discussed in more detail below.  CANCER HISTORY:   No history exists.    FAMILY HISTORY:  We obtained a detailed, 4-generation family history.  Significant diagnoses are listed below: Family History  Problem Relation Age of Onset  . Breast cancer Mother 45  . Pancreatic cancer Mother 41  . Skin cancer Father   . Colon cancer Paternal Uncle        dx late 60s-70  . Colon cancer Maternal Aunt        dx between 10-70  . Heart disease Maternal Grandfather   . Stomach cancer Paternal Grandfather 41  . Thyroid cancer Maternal Grandmother        dx in her 46s  . Heart disease Paternal Aunt   . Esophageal cancer Paternal Grandmother 89  . Heart attack Paternal Grandmother 89  . Cancer Other        PGF's brother with unknown cancer  . Cancer Other        MGM's brother with unknown cancer    The patient has two sons who are cancer free. She has one sister who has not had cancer, and she has a son who is cancer free.  The patient's mother is deceased and her father is living.  The patient's mother developed breast cancer at 55 and pancreatic cancer at 66.  Her mother had one sister and two brothers.  The sister had colon cancer in her 88's and died of heart disease.  Both brothers are living and cancer free.  The maternal grandparents are deceased.  The grandfather died of heart disease and the grandmother had thyroid cancer, unknown pathology.  The grandmother's brother died of an unknown  cancer.  The patient's father has had multiple skin cancers removed. He had two brothers and one sister.  One brother and his sister died of heart disease.  The other brother developed colon cancer in his 74's-70's.  Both paternal grandparents are deceased.  The grandmother had esophageal cancer at 47 and died of a heart attack, and the grandfather developed stomach cancer at 17 and died by 39.  He had multiple siblings, one who had an unknown cancer.  Kylie Miller Miller is unaware of previous family history of genetic testing for hereditary cancer risks. Patient's maternal ancestors are of Caucasian descent, and paternal ancestors are of Caucasian descent. There is no reported Ashkenazi Jewish ancestry. There is no known consanguinity.  GENETIC TEST RESULTS: Genetic testing reported out on October 03, 2017 through the multi-gene cancer panel found no deleterious mutations.  The Multi-Gene Panel offered by [facility] includes sequencing and/or deletion duplication testing of the following 80 genes: ALK, APC, ATM, AXIN2,BAP1,  BARD1, BLM, BMPR1A, BRCA1, BRCA2, BRIP1, CASR, CDC73, CDH1, CDK4, CDKN1B, CDKN1C, CDKN2A (p14ARF), CDKN2A (p16INK4a), CEBPA, CHEK2, CTNNA1, DICER1, DIS3L2, EGFR (c.2369C>T, p.Thr790Met variant only), EPCAM (Deletion/duplication testing only), FH, FLCN, GATA2, GPC3, GREM1 (Promoter region deletion/duplication testing only), HOXB13 (c.251G>A, p.Gly84Glu), HRAS, KIT, MAX, MEN1, MET, MITF (c.952G>A, p.Glu318Lys variant only), MLH1, MSH2, MSH3, MSH6, MUTYH, NBN, NF1, NF2, NTHL1, PALB2, PDGFRA, PHOX2B,  PMS2, POLD1, POLE, POT1, PRKAR1A, PTCH1, PTEN, RAD50, RAD51C, RAD51D, RB1, RECQL4, RET, RUNX1, SDHAF2, SDHA (sequence changes only), SDHB, SDHC, SDHD, SMAD4, SMARCA4, SMARCB1, SMARCE1, STK11, SUFU, TERT, TERT, TMEM127, TP53, TSC1, TSC2, VHL, WRN and WT1.  The test report has been scanned into EPIC and is located under the Molecular Pathology section of the Results Review tab.   We discussed with [name] that since the current genetic testing is not perfect, it is possible there may be a gene mutation in one of these genes that current testing cannot detect, but that chance is small. We also discussed, that it is possible that another gene that has not yet been discovered, or that we have not yet tested, is responsible for the cancer diagnoses in the family, and it is, therefore, important to remain in touch with cancer genetics in the future so that we can continue to offer Kylie Miller Miller the most up to date genetic testing.   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 At this time, it is unknown if this variant is associated with increased cancer risk or if this is a normal finding, but most variants such as this get reclassified to being inconsequential. It should not be used to make medical management decisions. With time, we suspect the lab will determine the significance of this variant, if any. If we do learn more about it, we will try to contact Kylie Miller Miller to discuss it further. However, it is important to stay in touch with [place] periodically and keep the address and phone number up to date.   CANCER SCREENING RECOMMENDATIONS:  This normal result is reassuring and indicates that Kylie Miller Miller does not likely have an increased risk of cancer due to a mutation in one of these genes.  We, therefore, recommended  Kylie Miller Miller continue to follow the cancer screening guidelines provided by her primary healthcare providers.   RECOMMENDATIONS FOR FAMILY MEMBERS: Women in this family might be at some increased risk of developing cancer, over the general population risk, simply due to the family history of cancer. We recommended women in this family have a yearly mammogram beginning at age 82, or 63 years younger than the earliest onset of cancer, an annual clinical breast exam, and perform monthly breast self-exams. Women in this family should also  have a gynecological exam as recommended by their primary provider. All family members should have a colonoscopy by age 51.  FOLLOW-UP: Lastly, we discussed with Kylie Miller Miller that cancer genetics is a rapidly advancing field and it is possible that new genetic tests will be appropriate for her and/or her family members in the future. We encouraged her to remain in contact with cancer genetics on an annual basis so we can update her personal and family histories and let her know of advances in cancer genetics that may benefit this family.   Our contact number was provided. Kylie Miller Miller questions were answered to her satisfaction, and she knows she is welcome to call us at anytime with additional questions or concerns.   Roma Kayser, MS, [facility] Certified Genetic Counselor [email or web address]

## 2018-02-12 ENCOUNTER — Encounter: Payer: Self-pay | Admitting: Pulmonary Disease

## 2018-02-12 ENCOUNTER — Other Ambulatory Visit: Payer: Self-pay

## 2018-02-12 NOTE — Telephone Encounter (Signed)
Per SN:  Ok to refill Cipro 500mg  one pill bid #20  Ok to refill Aofran 4mg  take one as needed #12 (I sent a message asking her sis she want the ODT or tablets)  Ok to refill Alprazolam 0.5mg  take one BID #90, but it looks like she is already on this.   Awaiting response from pt.

## 2018-02-13 MED ORDER — ONDANSETRON HCL 4 MG PO TABS: 4.0000 mg | ORAL_TABLET | Freq: Three times a day (TID) | ORAL | 0 refills | Status: DC | PRN

## 2018-02-13 MED ORDER — ALPRAZOLAM 0.5 MG PO TABS: 0.5000 mg | ORAL_TABLET | Freq: Every evening | ORAL | 5 refills | Status: DC | PRN

## 2018-02-13 MED ORDER — CIPROFLOXACIN HCL 500 MG PO TABS: 500.0000 mg | ORAL_TABLET | Freq: Two times a day (BID) | ORAL | 0 refills | Status: DC

## 2018-02-13 NOTE — Telephone Encounter (Signed)
Refills called in for pt's meds. Nothing further needed at this current time.

## 2018-04-12 NOTE — Telephone Encounter (Signed)
Per 2.25.19 e-mail chain, the cipro was sent to the pharmacy

## 2018-04-23 DIAGNOSIS — Z6823 Body mass index (BMI) 23.0-23.9, adult: Secondary | ICD-10-CM | POA: Diagnosis not present

## 2018-04-23 DIAGNOSIS — Z01419 Encounter for gynecological examination (general) (routine) without abnormal findings: Secondary | ICD-10-CM | POA: Diagnosis not present

## 2018-06-25 DIAGNOSIS — Z1231 Encounter for screening mammogram for malignant neoplasm of breast: Secondary | ICD-10-CM | POA: Diagnosis not present

## 2018-09-11 ENCOUNTER — Ambulatory Visit (INDEPENDENT_AMBULATORY_CARE_PROVIDER_SITE_OTHER)
Admission: RE | Admit: 2018-09-11 | Discharge: 2018-09-11 | Disposition: A | Discharge status: Home / Self Care | Payer: 59 | Source: Ambulatory Visit | Attending: Pulmonary Disease | Admitting: Pulmonary Disease

## 2018-09-11 ENCOUNTER — Ambulatory Visit: Payer: 59 | Admitting: Pulmonary Disease

## 2018-09-11 ENCOUNTER — Encounter: Payer: Self-pay | Admitting: Pulmonary Disease

## 2018-09-11 ENCOUNTER — Ambulatory Visit (INDEPENDENT_AMBULATORY_CARE_PROVIDER_SITE_OTHER): Payer: 59

## 2018-09-11 ENCOUNTER — Other Ambulatory Visit (INDEPENDENT_AMBULATORY_CARE_PROVIDER_SITE_OTHER): Payer: 59

## 2018-09-11 VITALS — BP 118/62 | HR 85 | Temp 98.2°F | Ht 66.0 in | Wt 150.6 lb

## 2018-09-11 DIAGNOSIS — R05 Cough: Secondary | ICD-10-CM | POA: Diagnosis not present

## 2018-09-11 DIAGNOSIS — Z0000 Encounter for general adult medical examination without abnormal findings: Secondary | ICD-10-CM

## 2018-09-11 DIAGNOSIS — R0600 Dyspnea, unspecified: Secondary | ICD-10-CM | POA: Diagnosis not present

## 2018-09-11 DIAGNOSIS — E7800 Pure hypercholesterolemia, unspecified: Secondary | ICD-10-CM | POA: Diagnosis not present

## 2018-09-11 DIAGNOSIS — R0609 Other forms of dyspnea: Secondary | ICD-10-CM

## 2018-09-11 DIAGNOSIS — J301 Allergic rhinitis due to pollen: Secondary | ICD-10-CM

## 2018-09-11 DIAGNOSIS — Z23 Encounter for immunization: Secondary | ICD-10-CM | POA: Diagnosis not present

## 2018-09-11 LAB — CBC WITH DIFFERENTIAL/PLATELET
BASOS PCT: 0.7 % (ref 0.0–3.0)
Basophils Absolute: 0 10*3/uL (ref 0.0–0.1)
EOS PCT: 3.6 % (ref 0.0–5.0)
Eosinophils Absolute: 0.2 10*3/uL (ref 0.0–0.7)
HEMATOCRIT: 39.3 % (ref 36.0–46.0)
HEMOGLOBIN: 13.4 g/dL (ref 12.0–15.0)
LYMPHS PCT: 30.8 % (ref 12.0–46.0)
Lymphs Abs: 1.7 10*3/uL (ref 0.7–4.0)
MCHC: 34 g/dL (ref 30.0–36.0)
MCV: 91.1 fl (ref 78.0–100.0)
Monocytes Absolute: 0.5 10*3/uL (ref 0.1–1.0)
Monocytes Relative: 9.7 % (ref 3.0–12.0)
Neutro Abs: 3.1 10*3/uL (ref 1.4–7.7)
Neutrophils Relative %: 55.2 % (ref 43.0–77.0)
Platelets: 318 10*3/uL (ref 150.0–400.0)
RBC: 4.32 Mil/uL (ref 3.87–5.11)
RDW: 13.2 % (ref 11.5–15.5)
WBC: 5.6 10*3/uL (ref 4.0–10.5)

## 2018-09-11 LAB — LIPID PANEL
CHOLESTEROL: 209 mg/dL — AB (ref 0–200)
HDL: 85 mg/dL (ref >39.00)
LDL CALC: 106 mg/dL — AB (ref 0–99)
NonHDL: 123.98
Total CHOL/HDL Ratio: 2
Triglycerides: 90 mg/dL (ref 0.0–149.0)
VLDL: 18 mg/dL (ref 0.0–40.0)

## 2018-09-11 LAB — TSH: TSH: 4.83 u[IU]/mL — ABNORMAL HIGH (ref 0.35–4.50)

## 2018-09-11 LAB — COMPREHENSIVE METABOLIC PANEL
ALBUMIN: 4.5 g/dL (ref 3.5–5.2)
ALT: 17 U/L (ref 0–35)
AST: 20 U/L (ref 0–37)
Alkaline Phosphatase: 58 U/L (ref 39–117)
BUN: 18 mg/dL (ref 6–23)
CALCIUM: 9.8 mg/dL (ref 8.4–10.5)
CHLORIDE: 103 meq/L (ref 96–112)
CO2: 30 mEq/L (ref 19–32)
Creatinine, Ser: 0.69 mg/dL (ref 0.40–1.20)
GFR: 94.21 mL/min (ref >60.00)
Glucose, Bld: 94 mg/dL (ref 70–99)
POTASSIUM: 4.6 meq/L (ref 3.5–5.1)
Sodium: 139 mEq/L (ref 135–145)
Total Bilirubin: 0.7 mg/dL (ref 0.2–1.2)
Total Protein: 7.7 g/dL (ref 6.0–8.3)

## 2018-09-11 LAB — POCT EXHALED NITRIC OXIDE: FENO LEVEL (PPB): 9

## 2018-09-11 MED ORDER — CLONAZEPAM 0.5 MG PO TABS: ORAL_TABLET | ORAL | 4 refills | Status: DC

## 2018-09-11 MED ORDER — SIMVASTATIN 20 MG PO TABS: 20.0000 mg | ORAL_TABLET | Freq: Every evening | ORAL | 3 refills | Status: DC

## 2018-09-11 NOTE — Patient Instructions (Signed)
Today we updated your med list in our EPIC system...    Continue your current medications the same...  Today we checked a Spirometry breathing test and the FeNO test for asthmatic type inflammation in the lungs...    Both test looked good and your airflow was essentially within normal limits...  We also checked your f/u CXR & FASTING blood work...    We will contact you w/ the results when available...   For the allergy symptoms>>    Try ZYREK 10mg  OTC each AM during the rough seasons, and try the [facility] nasal spray (1-2sprays in each nostril at bedtime)...  For the shortness of breath sensation (not able to get a deep breath, feeling forced to get the air in)>>    This most often comes from a try of chest wall muscle contraction/spasm, and responds best to a combination relaxer like KLONOPIN 0.5mg  - try 1/2 to 1 tab twice daily...  Call for any questions or if I can be of service in any way...  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    Wishing you, Shanon Brow, and your family good health, and much happiness in the years to come.Marland KitchenMarland Kitchen

## 2018-09-14 ENCOUNTER — Encounter: Payer: Self-pay | Admitting: Pulmonary Disease

## 2018-09-14 LAB — VITAMIN D 1,25 DIHYDROXY
Vitamin D 1, 25 (OH)2 Total: 53 pg/mL (ref 18–72)
Vitamin D2 1, 25 (OH)2: <8 pg/mL
Vitamin D3 1, 25 (OH)2: 53 pg/mL

## 2018-09-14 NOTE — Progress Notes (Signed)
Subjective:     Patient ID: Kylie Miller, female   DOB: Nov 28, 1966, 52 y.o.   MRN: [id number]  HPI 52 y/o WF here for a follow up visit and CPX...   ~  [name]:  she was last seen [name] doing well... we decided to start Lipitor '10mg'$ /d for her Chol with a great response, but she has since discontinued this med in favor of diet alone... we did f/u FLP w/ TChol 227, LDL 141- started Simva20... ~  October 09, 2009:  she's had a good 79mo tolerating Simva20 & working well... had plantar fasciitis w/ eval by [facility]- stretching, PT, NSAIDs Prn... no new complaints or concerns... ~  February 11, 2011:  here for CPX & 160moOV doing well w/o new complaints or concerns... she does c/o some incr gas & we discussed antigas rx w/ simethacone, etc... she continues to do well on the Simva20...  ~  June 13, 2012:  1643moV & CPX> AngJanequas had a good yr- feeling well & no new complaints or concerns...    CHOL> on Simva20 & f/u FLP looks good, all parameters at goals (see below); continue same...    GI> GERD, IBS, Polyp> still notes some gas problems but improved on Simethacone preps; she saw DrPyrtle for Colonoscopy 3/13 w/ one 7mm58mperplastic polyp removed, sm int hems seen, f/u planned 10 yrs... We reviewed prob list, meds, xrays and labs> see below>>  LABS 6/13:  FLP- allparameters at goals on Simva20;  Chems- wnl;  CBC- wnl;  TSH=2.37;  UA- clear   ~  August 07, 2013:  30mo2mo& CPX> Kylie Miller a good year- no new complaints or concerns...     AR> on Zyrtek OTC as needed...    CHOL> on Simva20 & f/u FLP looks good, all parameters at goals (see below); continue same...    GI> GERD, IBS, Polyp> still notes some gas problems but improved on Simethacone preps; she saw DrPyrtle for Colonoscopy 3/13 w/ one 7mm h74mrplastic polyp removed, sm int hems seen, f/u planned 10 yrs... We reviewed prob list, meds, xrays and labs> see below for updates >>   CXR 8/14 showed norm heart size, clear lungs,  NAD...  LABS 8/14:  FLP- at goals on Simva20;  Chems- wnl;  LFTs= wnl;  CBC- ok w/ Hg=12.2 MCV=92;  TSH=2.49...   ~  September 05, 2014:  Yearly ROV & CPX>  AngelaShaquandats a good yr- no new complaints or concerns, she & husb have started running & they competed in a 1/2Marathon... We reviewed the following medical problems during today's office visit >>     AR> on Zyrtek OTC as needed...    CHOL> on Simva20 & f/u FLP looks good, all parameters at goals (see below); continue same...    GI> GERD, IBS, Polyp> still notes some gas problems but improved on Simethacone preps prn; she saw DrPyrtle for Colonoscopy 3/13 w/ one 7mm hy32mplastic polyp removed, sm int hems seen, f/u planned 10 yrs... We reviewed prob list, meds, xrays and labs> see below for updates >> OK 2015 Flu shot today  LABS 9/15:  FLP- ok x LDL=111;  Chems- wnl;  CBC- wnl;  TSH=3.46...  ~  September 08, 2015:  Yearly ROV & CPX> Kylie Miller issues over the last yr- she had been training & running half-marathons w/ her husb but started having some right hip pain; she fell in May & [facility]  w/ L4-5 disc degen reported; they also saw a spot on her kidney & she was sent to ?[facility] but their eval indicated that ?one kidney moved & no spot was identified (per pt's recollection- we do not have notes from them);  Now she is c/o occas waking up at night w/ back pain & her exercise has been lim to yoga and elliptical machine per ortho... We reviewed the following medical problems during today's office visit >>     AR> on Zyrtek OTC as needed...    CHOL> on Simva20 & f/u FLP looks good, all parameters at goals (see below); continue same...    GI> GERD, IBS, Polyp> still notes some gas problems but improved on Simethacone preps prn; she saw DrPyrtle for Colonoscopy 3/13 w/ one 658m hyperplastic polyp removed, sm int hems seen, f/u planned 10 yrs...    GU> ?spot on her kidney per ortho XRays? She was  eval by Urology- nothing found by her report; we have requested records to be sent...    GYN> followed by DrTomblin, everything is ok per pt; she says she had a baseline BMD from them & it was OK...     Ortho> c/o back pain & right hip pain> as above- she had ortho eval DrBeane, we have requested records to be sent... We reviewed prob list, meds, xrays and labs> see below for updates >> OK 2016 Flu shot today & Rx written for shingles vaccine...  LABS 9/16:  FLP- at goals on Simva20;  Chems- wnl;  CBC- wnl w/ Hg=12.4;  TSH= 3/92 IMP/PLAN>>  Good general health, don't know what to make of the back/right hip discomfort/ ortho eval/ urology eval and we will request records to review...   ~  September 07, 2016:  Yearly ROV & CPX... 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 We reviewed the following medical problems during today's office visit >>     AR> on Zyrtek OTC as needed...    CHOL> on Simva20 & f/u FLP looks good, all parameters at goals (see below); continue same...    GI> GERD, IBS, Polyp> still notes some gas problems but improved on Simethacone preps prn; she saw DrPyrtle for Colonoscopy 3/13 w/ one 58m34myperplastic polyp removed, sm int hems seen, f/u planned 10 yrs...    GU> ?spot on her kidney per ortho XRays? She was eval by Urology- nothing found by her report; we have requested records to be sent...    GYN> followed by DrTomblin, everything is ok per pt; she says she had a baseline BMD from them & it was OK...     Ortho> c/o back pain & right hip pain> as above- she had ortho eval DrBeane, we have requested records to be sent... EXAM shows Afeb, VSS, O2sat=98% on  RA at rest;  HEENT- neg, mallampati2;  Chest- clear w/o w/r/r;  Heart- RR w/o m/r/g;  Abd- soft, nontender, neg;  Ext- neg w/o c/c/e;  Neuro- intact...  CXR 09/07/16>  Norm heart size, clear lungs, NAD, mild dengen changes in Tspine...  XRays Cspine 09/07/16>  No fx or dislocation, mild degenerative changes w/ disc  sp flattening at C5-6 level & mild spurring...  EKG 09/07/16>  SBrady, rate58, wnl...  LABS 09/07/16>  FLP- all parameters at goals on Simva20;  Chems- wnl;  CBC- wnl;  TSH=4.83 borderline elev;  VitD=50... IMP/PLAN>>  Raphaela is under some stress w/ work- advised on Alpraz dosing;  She will try rest, heat, topical meds, Aleve as needed for neck + KT strips etc;  Given 2018 FLU vaccine...  ~  September 07, 2017:  Yearly ROV & CPX>  Janace Hoard has retired from [employer] & she & Shanon Brow are living the dream- helping kids and grands, going to their [place], & traveling... Medically speaking she is doing extremely well without complaints or concerns;  She notes that in [id number] she was exposed to a lot of pollen w/ congestion/ drainage/ cough=> given ZPak & Pred & resolved quickly;  She has additionally noted back discomfort=> did PT exercises on her own & yoga w/ improvement... We reviewed the following medical problems during today's office visit>      AR> on Zyrtek OTC as needed, rec to use Flonase qhs as well...    CHOL> on Simva20 & f/u FLP 9/18 looks good, all parameters at goals (see below); continue same...    GI> GERD, IBS, Polyp> still notes some gas problems but improved on Simethacone preps prn; she saw DrPyrtle for Colonoscopy 3/13 w/ one 78m hyperplastic polyp removed, sm int hems seen, f/u planned 10 yrs...    GU> ?spot on her kidney per ortho XRays? 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     GYN> followed by DrTomblin, everything is ok per pt; she says she had a baseline BMD from them & it was OK...    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    Ortho> c/o back pain & right hip pain> as above- she had ortho eval DrBeane, we have requested records to be sent... EXAM shows Afeb, VSS, O2sat=100% on RA at rest;  HEENT- neg, mallampati2;  Chest- clear w/o w/r/r;  Heart- RR w/o m/r/g;  Abd- soft, nontender, neg;  Ext- neg w/o c/c/e;  Neuro- intact...  LABS 09/07/17>  FLP- at goals on Simva20 + diet;  Chems- wnl;  CBC- ok w/ Hg=12.4, WBC=4.6, eos=100;  TSH=4.16;  VitD=57 IMP/PLAN>>  AAllayahenjoys excellent general medical health; she has mild GAD- rx w/ Alprazolam0.'5mg'$  prn; we gave her the 2018 FLU vaccine; we plan roc/ cpx in 169yr.   ~  September 11, 2018:  1y50yrV & CPX>      CXR 09/11/18 (independently reviewed by me in the PACS system) showed norm heart size, clear lungs- NAD  Spirometry 09/11/18>  FVC=3.3 (89%), FEV1=2.5 (86%), %1sec=75%, mid-flows=83% predicted;  This is a normal spirometry test w/o airflow obstruction...   FeNO 09/11/18> FeNO=9ppb  (norm<25ppb- airway inflamm is unlikely)  LABS 09/11/18:  FLP- ok on Simva20+diet;  Chems- wnl;  CBC- wnl;  TSH=4.83;  VitD- pending...  IMP/PLAN>>            Problem List:    PHYSICAL EXAMINATION (ICD-V70.0) - GYN = DrTomblin for Pap, Mammogram, BMD, etc... she is up to date w/ her check ups... ~  CXR 10/10 showed normal heart size, clear lungs, NAD... ~  EKG 2/12 showed NSR,  rate67, WNL.Marland Kitchen. ~  CXR 8/14 showed norm heart size, clear lungs, NAD...  ALLERGIC RHINITIS (ICD-477.9) - OTC Claritin/ Zyrtek as needed.  HYPERCHOLESTEROLEMIA (ICD-272.0) - on SIMVASTATIN '20mg'$ /d + low chol diet... ~  prev FLP's w/ TChol 230-240, TG 60's, HDL 68-70, LDL 160 range. ~  FLP on statin Rx showed TChol 150-170, TG 3-60, HDL 61-67, LDL 83-92 ~  FLP 4/09 showed TChol 227, TG 58, HDL 65,  LDL 141... rec> start SIMVA20. ~  [place] 10/09 on Simva20 showed TChol 156, TG 49, HDL 66, LDL 80 ~  FLP 10/10 on Simva20 showed TChol 161, TG 67, HDL 59, LDL 89 ~  FLP 2/12 on Simva20 showed TChol 198, TG 102, HDL 70, LDL 107... we reviewed diet/ exercise. ~  FLP 6/13 on Simva20 showed TChol 179, TG 66, HDL 76, LDL 90 ~  FLP 8/14 on Simva20 showed TChol 174, TG 51, HDL 74, LDL 90 ~  FLP 9/15 on Simva20 showed TChol 191, TG 36, HDL 73, LDL 111... We reviewed diet & exercise. ~  FLP 9/16 on Simva20 showed TChol 169, TG 68, HDL 72, LDL 83 ~  FLP 9/17 on simva20 showed TChol 199, TG 93, HDL 80, LDL 101 ~  FLP 9/18 on Simva20 showed TChol 195, TG 57, HDL 100, LDL 83  GERD >> no recent problems... prev used Prilosec vs Ranitadine...  IRRITABLE BOWEL SYNDROME COLON POLYP >> 28m dec colon polyp removed 2/13= hyperplastic. INTERNAL HEMORRHOIDS ~  She has had complaints of gas & treated w/ OTC Simethacone preps... ~  2/13:  GI eval DrPyrtle for change in bowel habits w/ freq & intermittent blood seen> neg exam, normal labs including Tissue Transglutaminase & IgA level (neg for Celiac dis)... ~  Colonoscopy 2/13 by DrPyrtle 2/13 showed one 720mpolyp in desc colon- hyperplastic, sm int hems... F/u planned 10 yrs.  Family Hx of Pancreatic Cancer >>  ~  the patient's mother had a history of pancreatic cancer and she discussed this w/ DrPyrtle 2/13;  They reviewed how there are no guidelines currently supporting screening in asymptomatic individuals with a family history of pancreatic cancer (this being based on the fact that there is no known family history of hereditary pancreatitis or known hereditary/genetic cancer syndromes)...  Right hip pain w/ running> reported this in 2016, she's had Ortho eval by DrBeane but we do not have his records... BACK PAIN, LUMBAR (ICD-724.2) - hx lumbar disc dis, no surgery... developed plantar fasciitis 2010 w/ eval by [facility]- stretching, PT, NSAIDs Prn... ~  9/16>  she reported right hip pain & back discomfort; [facility] degen discs & exercise restricted to yoga & elliptical  ANXIETY (ICD-300.00)   Past Surgical History:  Procedure Laterality Date  . CESAREAN SECTION    . VAGINAL HYSTERECTOMY      Outpatient Encounter Medications as of 09/11/2018  Medication Sig  . calcium carbonate (OS-CAL) 1250 (500 Ca) MG chewable tablet Chew 1 tablet by mouth daily.  . Marland Kitchenlucosamine-chondroitin 500-400 MG tablet Take 1 tablet by mouth at bedtime.  . Multiple Vitamins-Minerals (MULTIVITAMIN WITH MINERALS) tablet Take 1 tablet by mouth daily.  . simvastatin (ZOCOR) 20 MG tablet Take 1 tablet (20 mg total) by mouth every evening.  . [DISCONTINUED] simvastatin (ZOCOR) 20 MG tablet Take 1 tablet (20 mg total) by mouth every evening.  . Marland KitchenLPRAZolam (XANAX) 0.5 MG tablet Take 1 tablet (0.5 mg total) by mouth at bedtime as needed for anxiety. (Patient not taking:  Reported on 09/11/2018)  . ciprofloxacin (CIPRO) 500 MG tablet Take 1 tablet (500 mg total) by mouth 2 (two) times daily. (Patient not taking: Reported on 09/11/2018)  . clonazePAM (KLONOPIN) 0.5 MG tablet 1/2 to 1 tab twice a day as needed  . ondansetron (ZOFRAN) 4 MG tablet Take 1 tablet (4 mg total) by mouth every 8 (eight) hours as needed for nausea or vomiting. (Patient not taking: Reported on 09/11/2018)   No facility-administered encounter medications on file as of 09/11/2018.     Allergies  Allergen Reactions  . Codeine     REACTION: nausea    Immunization History  Administered Date(s) Administered  . Influenza Split 09/19/2011, 10/07/2012  . Influenza,inj,Quad PF,6+ Mos 09/05/2014, 09/08/2015, 09/07/2016, 09/07/2017, 09/11/2018  . Tdap 03/29/2013  . Zoster 03/07/2016    Current Medications, Allergies, Past Medical History, Past Surgical History, Family History, and Social History were reviewed in [employer] record.   Review of Systems         The patient complains of gas/bloating.  The patient denies fever, chills, sweats, anorexia, fatigue, weakness, malaise, weight loss, sleep disorder, blurring, diplopia, eye irritation, eye discharge, vision loss, eye pain, photophobia, earache, ear discharge, tinnitus, decreased hearing, nasal congestion, nosebleeds, sore throat, hoarseness, chest pain, palpitations, syncope, dyspnea on exertion, orthopnea, PND, peripheral edema, cough, dyspnea at rest, excessive sputum, hemoptysis, wheezing, pleurisy, nausea, vomiting, diarrhea, constipation, change in bowel habits, abdominal pain, melena, hematochezia, jaundice, indigestion/heartburn, dysphagia, odynophagia, dysuria, hematuria, urinary frequency, urinary hesitancy, nocturia, incontinence, back pain, joint pain, joint swelling, muscle cramps, muscle weakness, stiffness, arthritis, sciatica, restless legs, leg pain at night, leg pain with exertion, rash, itching, dryness, suspicious lesions, paralysis, paresthesias, seizures, tremors, vertigo, transient blindness, frequent falls, frequent headaches, difficulty walking, depression, anxiety, memory loss, confusion, cold intolerance, heat intolerance, polydipsia, polyphagia, polyuria, unusual weight change, abnormal bruising, bleeding, enlarged lymph nodes, urticaria, allergic rash, hay fever, and recurrent infections.     Objective:   Physical Exam     WD, WN, 52 y/o WF in NAD... GENERAL:  Alert & oriented; pleasant & cooperative... HEENT:  [place]/AT, EOM-wnl, PERRLA, EACs-clear, TMs-wnl, NOSE-clear, THROAT-clear & wnl. NECK:  Supple w/ full ROM; no JVD; normal carotid impulses w/o bruits; no thyromegaly or nodules palpated; no lymphadenopathy. CHEST:  Clear to P & A; without wheezes/ rales/ or rhonchi. HEART:  Regular Rhythm; without murmurs/ rubs/ or gallops. ABDOMEN:  Soft & nontender; normal bowel sounds; no organomegaly or masses detected. EXT: without deformities or arthritic changes; no varicose  veins/ venous insuffic/ or edema. NEURO:  CN's intact; motor testing normal; sensory testing normal; gait normal & balance OK. DERM:  No lesions noted; no rash etc...  RADIOLOGY DATA:  Reviewed in the EPIC EMR & discussed w/ the patient...  LABORATORY DATA:  Reviewed in the EPIC EMR & discussed w/ the patient...   Assessment:     CPX>>   ~  09/07/16>  Kashara is under some stress w/ work- advised on Alpraz dosing;  She will try rest, heat, topical meds, Aleve as needed for neck + KT strips etc;  Given 2018 FLU vaccine ~  09/07/17>   Ivon enjoys excellent general medical health; she has mild GAD- rx w/ Alprazolam0.'5mg'$  prn; we gave her the 2018 FLU vaccine; we plan roc/ cpx in 15yr  AR>  She uses OTC antihist as needed...  CHOL>  Stable on Simva20 & FLP looks good on this dose, tol well, etc...  GERD>  No recent symptoms and  she ises OTC PPI vs H2blockers as needed...  IBS, Polyp, Hem>  She had GI eval & colonoscopy 2/13 by DrPyrtle; colon revealed one hyperplastic polyp; symptoms have subsided since them- w/o incr stool freq or recurrent blood...  GU>  Sent to Urology by Ortho w/ ?spot on her kidney; she reports eval was neg, nothing found & we are awaiting ecods to review...  Ortho>  She has back pain issues eval by [facility] & exercise has been lim to yoga 7 elliptical; we will request records to review...      Plan:     Patient's Medications  New Prescriptions   CLONAZEPAM (KLONOPIN) 0.5 MG TABLET    1/2 to 1 tab twice a day as needed  Previous Medications   ALPRAZOLAM (XANAX) 0.5 MG TABLET    Take 1 tablet (0.5 mg total) by mouth at bedtime as needed for anxiety.   CALCIUM CARBONATE (OS-CAL) 1250 (500 CA) MG CHEWABLE TABLET    Chew 1 tablet by mouth daily.   CIPROFLOXACIN (CIPRO) 500 MG TABLET    Take 1 tablet (500 mg total) by mouth 2 (two) times daily.   GLUCOSAMINE-CHONDROITIN 500-400 MG TABLET    Take 1 tablet by mouth at bedtime.   MULTIPLE VITAMINS-MINERALS  (MULTIVITAMIN WITH MINERALS) TABLET    Take 1 tablet by mouth daily.   ONDANSETRON (ZOFRAN) 4 MG TABLET    Take 1 tablet (4 mg total) by mouth every 8 (eight) hours as needed for nausea or vomiting.  Modified Medications   Modified Medication Previous Medication   SIMVASTATIN (ZOCOR) 20 MG TABLET simvastatin (ZOCOR) 20 MG tablet      Take 1 tablet (20 mg total) by mouth every evening.    Take 1 tablet (20 mg total) by mouth every evening.  Discontinued Medications   No medications on file

## 2019-07-03 LAB — HM DEXA SCAN: HM Dexa Scan: NORMAL

## 2019-07-04 LAB — HM MAMMOGRAPHY

## 2019-09-24 ENCOUNTER — Other Ambulatory Visit: Payer: Self-pay | Admitting: Pulmonary Disease

## 2019-10-10 ENCOUNTER — Other Ambulatory Visit: Payer: Self-pay | Admitting: Pulmonary Disease

## 2019-11-19 ENCOUNTER — Other Ambulatory Visit: Payer: Self-pay

## 2019-11-20 ENCOUNTER — Ambulatory Visit (INDEPENDENT_AMBULATORY_CARE_PROVIDER_SITE_OTHER): Payer: 59 | Admitting: Physician Assistant

## 2019-11-20 ENCOUNTER — Encounter: Payer: Self-pay | Admitting: Physician Assistant

## 2019-11-20 VITALS — BP 102/68 | HR 72 | Temp 97.9°F | Ht 65.25 in | Wt 149.2 lb

## 2019-11-20 DIAGNOSIS — Z136 Encounter for screening for cardiovascular disorders: Secondary | ICD-10-CM | POA: Diagnosis not present

## 2019-11-20 DIAGNOSIS — E559 Vitamin D deficiency, unspecified: Secondary | ICD-10-CM

## 2019-11-20 DIAGNOSIS — Z0001 Encounter for general adult medical examination with abnormal findings: Secondary | ICD-10-CM

## 2019-11-20 DIAGNOSIS — Z0000 Encounter for general adult medical examination without abnormal findings: Secondary | ICD-10-CM | POA: Diagnosis not present

## 2019-11-20 DIAGNOSIS — Z13220 Encounter for screening for lipoid disorders: Secondary | ICD-10-CM

## 2019-11-20 DIAGNOSIS — Z20828 Contact with and (suspected) exposure to other viral communicable diseases: Secondary | ICD-10-CM

## 2019-11-20 DIAGNOSIS — Z8249 Family history of ischemic heart disease and other diseases of the circulatory system: Secondary | ICD-10-CM

## 2019-11-20 DIAGNOSIS — Z20822 Contact with and (suspected) exposure to covid-19: Secondary | ICD-10-CM

## 2019-11-20 LAB — COMPREHENSIVE METABOLIC PANEL
ALT: 27 U/L (ref 0–35)
AST: 26 U/L (ref 0–37)
Albumin: 4.5 g/dL (ref 3.5–5.2)
Alkaline Phosphatase: 70 U/L (ref 39–117)
BUN: 16 mg/dL (ref 6–23)
CO2: 29 mEq/L (ref 19–32)
Calcium: 9.8 mg/dL (ref 8.4–10.5)
Chloride: 103 mEq/L (ref 96–112)
Creatinine, Ser: 0.73 mg/dL (ref 0.40–1.20)
GFR: 82.69 mL/min (ref >60.00)
Glucose, Bld: 93 mg/dL (ref 70–99)
Potassium: 4.3 mEq/L (ref 3.5–5.1)
Sodium: 140 mEq/L (ref 135–145)
Total Bilirubin: 0.7 mg/dL (ref 0.2–1.2)
Total Protein: 7.3 g/dL (ref 6.0–8.3)

## 2019-11-20 LAB — LIPID PANEL
Cholesterol: 254 mg/dL — ABNORMAL HIGH (ref 0–200)
HDL: 94.4 mg/dL (ref >39.00)
LDL Cholesterol: 145 mg/dL — ABNORMAL HIGH (ref 0–99)
NonHDL: 159.9
Total CHOL/HDL Ratio: 3
Triglycerides: 74 mg/dL (ref 0.0–149.0)
VLDL: 14.8 mg/dL (ref 0.0–40.0)

## 2019-11-20 LAB — VITAMIN D 25 HYDROXY (VIT D DEFICIENCY, FRACTURES): VITD: 56.22 ng/mL (ref 30.00–100.00)

## 2019-11-20 LAB — CBC WITH DIFFERENTIAL/PLATELET
Basophils Absolute: 0 10*3/uL (ref 0.0–0.1)
Basophils Relative: 0.8 % (ref 0.0–3.0)
Eosinophils Absolute: 0.1 10*3/uL (ref 0.0–0.7)
Eosinophils Relative: 2.8 % (ref 0.0–5.0)
HCT: 37.5 % (ref 36.0–46.0)
Hemoglobin: 12.7 g/dL (ref 12.0–15.0)
Lymphocytes Relative: 30.4 % (ref 12.0–46.0)
Lymphs Abs: 1.6 10*3/uL (ref 0.7–4.0)
MCHC: 33.9 g/dL (ref 30.0–36.0)
MCV: 92.2 fl (ref 78.0–100.0)
Monocytes Absolute: 0.5 10*3/uL (ref 0.1–1.0)
Monocytes Relative: 9 % (ref 3.0–12.0)
Neutro Abs: 3 10*3/uL (ref 1.4–7.7)
Neutrophils Relative %: 57 % (ref 43.0–77.0)
Platelets: 319 10*3/uL (ref 150.0–400.0)
RBC: 4.07 Mil/uL (ref 3.87–5.11)
RDW: 13 % (ref 11.5–15.5)
WBC: 5.2 10*3/uL (ref 4.0–10.5)

## 2019-11-20 NOTE — Patient Instructions (Signed)
It was great to see you!  Please go to the lab for blood work.   Our office will call you with your results unless you have chosen to receive results via MyChart.  If your blood work is normal we will follow-up each year for physicals and as scheduled for chronic medical problems.  If anything is abnormal we will treat accordingly and get you in for a follow-up.  Take care,  Boyd Litaker    Coronary Calcium Scan A coronary calcium scan is an imaging test used to look for deposits of calcium and other fatty materials (plaques) in the inner lining of the blood vessels of the heart (coronary arteries). These deposits of calcium and plaques can partly clog and narrow the coronary arteries without producing any symptoms or warning signs. This puts a person at risk for a heart attack. This test can detect these deposits before symptoms develop. Tell a health care provider about:  Any allergies you have.  All medicines you are taking, including vitamins, herbs, eye drops, creams, and over-the-counter medicines.  Any problems you or family members have had with anesthetic medicines.  Any blood disorders you have.  Any surgeries you have had.  Any medical conditions you have.  Whether you are pregnant or may be pregnant. What are the risks? Generally, this is a safe procedure. However, problems may occur, including:  Harm to a pregnant woman and her unborn baby. This test involves the use of radiation. Radiation exposure can be dangerous to a pregnant woman and her unborn baby. If you are pregnant, you generally should not have this procedure done.  Slight increase in the risk of cancer. This is because of the radiation involved in the test. What happens before the procedure? No preparation is needed for this procedure. What happens during the procedure?   You will undress and remove any jewelry around your neck or chest.  You will put on a hospital gown.  Sticky electrodes will be  placed on your chest. The electrodes will be connected to an electrocardiogram (ECG) machine to record a tracing of the electrical activity of your heart.  A CT scanner will take pictures of your heart. During this time, you will be asked to lie still and hold your breath for 2-3 seconds while a picture of your heart is being taken. The procedure may vary among health care providers and hospitals. What happens after the procedure?  You can get dressed.  You can return to your normal activities.  It is up to you to get the results of your test. Ask your health care provider, or the department that is doing the test, when your results will be ready. Summary  A coronary calcium scan is an imaging test used to look for deposits of calcium and other fatty materials (plaques) in the inner lining of the blood vessels of the heart (coronary arteries).  Generally, this is a safe procedure. Tell your health care provider if you are pregnant or may be pregnant.  No preparation is needed for this procedure.  A CT scanner will take pictures of your heart.  You can return to your normal activities after the scan is done.    Health Maintenance, Female Adopting a healthy lifestyle and getting preventive care are important in promoting health and wellness. Ask your health care provider about:  The right schedule for you to have regular tests and exams.  Things you can do on your own to prevent diseases and keep yourself  healthy. What should I know about diet, weight, and exercise? Eat a healthy diet   Eat a diet that includes plenty of vegetables, fruits, low-fat dairy products, and lean protein.  Do not eat a lot of foods that are high in solid fats, added sugars, or sodium. Maintain a healthy weight Body mass index (BMI) is used to identify weight problems. It estimates body fat based on height and weight. Your health care provider can help determine your BMI and help you achieve or maintain  a healthy weight. Get regular exercise Get regular exercise. This is one of the most important things you can do for your health. Most adults should:  Exercise for at least 150 minutes each week. The exercise should increase your heart rate and make you sweat (moderate-intensity exercise).  Do strengthening exercises at least twice a week. This is in addition to the moderate-intensity exercise.  Spend less time sitting. Even light physical activity can be beneficial. Watch cholesterol and blood lipids Have your blood tested for lipids and cholesterol at 55 years of age, then have this test every 5 years. Have your cholesterol levels checked more often if:  Your lipid or cholesterol levels are high.  You are older than 55 years of age.  You are at high risk for heart disease. What should I know about cancer screening? Depending on your health history and family history, you may need to have cancer screening at various ages. This may include screening for:  Breast cancer.  Cervical cancer.  Colorectal cancer.  Skin cancer.  Lung cancer. What should I know about heart disease, diabetes, and high blood pressure? Blood pressure and heart disease  High blood pressure causes heart disease and increases the risk of stroke. This is more likely to develop in people who have high blood pressure readings, are of African descent, or are overweight.  Have your blood pressure checked: ? Every 3-5 years if you are 57-50 years of age. ? Every year if you are 69 years old or older. Diabetes Have regular diabetes screenings. This checks your fasting blood sugar level. Have the screening done:  Once every three years after age 80 if you are at a normal weight and have a low risk for diabetes.  More often and at a younger age if you are overweight or have a high risk for diabetes. What should I know about preventing infection? Hepatitis B If you have a higher risk for hepatitis B, you should  be screened for this virus. Talk with your health care provider to find out if you are at risk for hepatitis B infection. Hepatitis C Testing is recommended for:  Everyone born from [birth year] through 1967.  Anyone with known risk factors for hepatitis C. Sexually transmitted infections (STIs)  Get screened for STIs, including gonorrhea and chlamydia, if: ? You are sexually active and are younger than 55 years of age. ? You are older than 55 years of age and your health care provider tells you that you are at risk for this type of infection. ? Your sexual activity has changed since you were last screened, and you are at increased risk for chlamydia or gonorrhea. Ask your health care provider if you are at risk.  Ask your health care provider about whether you are at high risk for HIV. Your health care provider may recommend a prescription medicine to help prevent HIV infection. If you choose to take medicine to prevent HIV, you should first get tested for HIV.  You should then be tested every 3 months for as long as you are taking the medicine. Pregnancy  If you are about to stop having your period (premenopausal) and you may become pregnant, seek counseling before you get pregnant.  Take 400 to 800 micrograms (mcg) of folic acid every day if you become pregnant.  Ask for birth control (contraception) if you want to prevent pregnancy. Osteoporosis and menopause Osteoporosis is a disease in which the bones lose minerals and strength with aging. This can result in bone fractures. If you are 52 years old or older, or if you are at risk for osteoporosis and fractures, ask your health care provider if you should:  Be screened for bone loss.  Take a calcium or vitamin D supplement to lower your risk of fractures.  Be given hormone replacement therapy (HRT) to treat symptoms of menopause. Follow these instructions at home: Lifestyle  Do not use any products that contain nicotine or tobacco, such  as cigarettes, e-cigarettes, and chewing tobacco. If you need help quitting, ask your health care provider.  Do not use street drugs.  Do not share needles.  Ask your health care provider for help if you need support or information about quitting drugs. Alcohol use  Do not drink alcohol if: ? Your health care provider tells you not to drink. ? You are pregnant, may be pregnant, or are planning to become pregnant.  If you drink alcohol: ? Limit how much you use to 0-1 drink a day. ? Limit intake if you are breastfeeding.  Be aware of how much alcohol is in your drink. In the [place] one drink equals one 12 oz bottle of beer (355 mL), one 5 oz glass of wine (148 mL), or one 1 oz glass of hard liquor (44 mL). General instructions  Schedule regular health, dental, and eye exams.  Stay current with your vaccines.  Tell your health care provider if: ? You often feel depressed. ? You have ever been abused or do not feel safe at home. Summary  Adopting a healthy lifestyle and getting preventive care are important in promoting health and wellness.  Follow your health care provider's instructions about healthy diet, exercising, and getting tested or screened for diseases.  Follow your health care provider's instructions on monitoring your cholesterol and blood pressure. This information is not intended to replace advice given to you by your health care provider. Make sure you discuss any questions you have with your health care provider. Document Released: 06/20/2011 Document Revised: 11/28/2018 Document Reviewed: 11/28/2018 Elsevier Patient Education  2020 [employer].

## 2019-11-20 NOTE — Progress Notes (Signed)
Kylie Miller is a 53 y.o. female here to Establish care.  I acted as a Education administrator for [employer], PA-C Anselmo Pickler, LPN  History of Present Illness:   Chief Complaint  Patient presents with  . Establish Care  . Annual Exam    Acute Concerns: COVID antibody testing -- had covid earlier this year in June. Would like antibody testing. Denies any specific concerns about lingering symptoms today. Family hx cardiac disease -- is wondering about screening EKG or other test to make sure she is healthy. Denies: cp, sob, lower leg swelling  Chronic Issues: HLD -- had myalgias with Lipitor and is now on Zocor. Was on 10 mg at first.  Takes daily.  Lumbar back pain -- has had to stop running due to back pain; "has a little bit of fluid" around her L4-5 discs. Vit d deficiency -- takes Vit D regularly, needs updated lab  Health Maintenance: Immunizations -- UTD Colonoscopy -- UTD, due 03/08/2022 Mammogram -- done will sent report PAP -- N/A Hysterectomy Bone Density -- N/A Diet -- [employer] lifetime member; eats lots of lean proteins Sleep habits -- intermittent issues with insomnia 2/2 menopause Exercise -- walks for exercise Weight -- Weight: 149 lb 4 oz (67.7 kg)  Body mass index is 24.65 kg/m. Mood -- has an occasional episode of "depression" once a month, thinks its hormone related, no SI/HI Alcohol -- social, no excessive intake Tobacco -- none  Depression screen PHQ 2/9 11/20/2019  Decreased Interest 0  Down, Depressed, Hopeless 0  PHQ - 2 Score 0    No flowsheet data found.   Other providers/specialists: Patient Care Team: Inda Coke, [place] as PCP - General (Physician Assistant) Everlene Farrier, MD as Consulting Physician (Obstetrics and Gynecology)   Past Medical History:  Diagnosis Date  . Allergic rhinitis   . Anxiety   . Family history of breast cancer   . Family history of colon cancer   . Family history of pancreatic cancer   . Family history of thyroid  cancer   . Hyperlipidemia    family history  . IBS (irritable bowel syndrome)    resolved  . Lumbar back pain   . Vaginal delivery 1992     Social History   Socioeconomic History  . Marital status: Married    Spouse name: david   . Number of children: 2  . Years of education: Not on file  . Highest education level: Not on file  Occupational History  . Occupation: Armed forces technical officer: [employer]  . Financial resource strain: Not on file  . Food insecurity    Worry: Not on file    Inability: Not on file  . Transportation needs    Medical: Not on file    Non-medical: Not on file  Tobacco Use  . Smoking status: Never Smoker  . Smokeless tobacco: Never Used  Substance and Sexual Activity  . Alcohol use: Yes    Comment: 1 or less  . Drug use: No  . Sexual activity: Not on file  Lifestyle  . Physical activity    Days per week: Not on file    Minutes per session: Not on file  . Stress: Not on file  Relationships  . Social Herbalist on phone: Not on file    Gets together: Not on file    Attends religious service: Not on file    Active member of club or organization:  Not on file    Attends meetings of clubs or organizations: Not on file    Relationship status: Not on file  . Intimate partner violence    Fear of current or ex partner: Not on file    Emotionally abused: Not on file    Physically abused: Not on file    Forced sexual activity: Not on file  Other Topics Concern  . Not on file  Social History Narrative  . Not on file    Past Surgical History:  Procedure Laterality Date  . CESAREAN SECTION  1996  . VAGINAL HYSTERECTOMY      Family History  Problem Relation Age of Onset  . Breast cancer Mother 48  . Pancreatic cancer Mother 25  . Skin cancer Father   . Colon cancer Paternal Uncle        dx late 60s-70  . Colon cancer Maternal Aunt        dx between 4-70  . Heart disease Maternal Grandfather   .  Stomach cancer Paternal Grandfather 47  . Thyroid cancer Maternal Grandmother        dx in her 37s  . Heart disease Paternal Aunt   . Esophageal cancer Paternal Grandmother 89  . Heart attack Paternal Grandmother 89  . Cancer Other        PGF's brother with unknown cancer  . Cancer Other        MGM's brother with unknown cancer    Allergies  Allergen Reactions  . Codeine     REACTION: nausea     Current Medications:   Current Outpatient Medications:  .  ALPRAZolam (XANAX) 0.5 MG tablet, Take 1 tablet (0.5 mg total) by mouth at bedtime as needed for anxiety., Disp: 90 tablet, Rfl: 5 .  calcium carbonate (OS-CAL) 1250 (500 Ca) MG chewable tablet, Chew 1 tablet by mouth daily., Disp: , Rfl:  .  ciprofloxacin (CIPRO) 500 MG tablet, Take 1 tablet (500 mg total) by mouth 2 (two) times daily., Disp: 20 tablet, Rfl: 0 .  glucosamine-chondroitin 500-400 MG tablet, Take 1 tablet by mouth at bedtime., Disp: , Rfl:  .  Multiple Vitamins-Minerals (MULTIVITAMIN WITH MINERALS) tablet, Take 1 tablet by mouth daily., Disp: , Rfl:  .  ondansetron (ZOFRAN) 4 MG tablet, Take 1 tablet (4 mg total) by mouth every 8 (eight) hours as needed for nausea or vomiting., Disp: 12 tablet, Rfl: 0 .  simvastatin (ZOCOR) 20 MG tablet, Take 1 tablet (20 mg total) by mouth every evening., Disp: 90 tablet, Rfl: 3 .  Vitamin D, Cholecalciferol, 10 MCG (400 UNIT) CAPS, Take 2 capsules by mouth daily., Disp: , Rfl:    Review of Systems:   Review of Systems  Constitutional: Negative for chills, fever, malaise/fatigue and weight loss.  HENT: Negative for hearing loss, sinus pain and sore throat.   Respiratory: Negative for cough and hemoptysis.   Cardiovascular: Negative for chest pain, palpitations, leg swelling and PND.  Gastrointestinal: Negative for abdominal pain, constipation, diarrhea, heartburn, nausea and vomiting.  Genitourinary: Negative for dysuria, frequency and urgency.  Musculoskeletal: Negative for  back pain, myalgias and neck pain.  Skin: Negative for itching and rash.  Neurological: Negative for dizziness, tingling, seizures and headaches.  Endo/Heme/Allergies: Negative for polydipsia.  Psychiatric/Behavioral: Negative for depression. The patient is not nervous/anxious.     Vitals:   Vitals:   11/20/19 0945  BP: 102/68  Pulse: 72  Temp: 97.9 F (36.6 C)  TempSrc: Temporal  SpO2: 96%  Weight: 149 lb 4 oz (67.7 kg)  Height: 5' 5.25" (1.657 m)      Body mass index is 24.65 kg/m.  Physical Exam:   Physical Exam Vitals signs and nursing note reviewed.  Constitutional:      General: She is not in acute distress.    Appearance: Normal appearance. She is well-developed. She is not ill-appearing or toxic-appearing.  HENT:     Head: Normocephalic and atraumatic.     Right Ear: Tympanic membrane, ear canal and external ear normal. Tympanic membrane is not erythematous, retracted or bulging.     Left Ear: Tympanic membrane, ear canal and external ear normal. Tympanic membrane is not erythematous, retracted or bulging.  Eyes:     General: Lids are normal.     Conjunctiva/sclera: Conjunctivae normal.     Pupils: Pupils are equal, round, and reactive to light.  Neck:     Musculoskeletal: Full passive range of motion without pain.     Trachea: Trachea normal.  Cardiovascular:     Rate and Rhythm: Normal rate and regular rhythm.     Heart sounds: Normal heart sounds, S1 normal and S2 normal.  Pulmonary:     Effort: Pulmonary effort is normal. No tachypnea or respiratory distress.     Breath sounds: Normal breath sounds. No decreased breath sounds, wheezing, rhonchi or rales.  Abdominal:     General: Bowel sounds are normal.     Palpations: Abdomen is soft.     Tenderness: There is no abdominal tenderness.  Musculoskeletal: Normal range of motion.  Lymphadenopathy:     Cervical: No cervical adenopathy.  Skin:    General: Skin is warm and dry.  Neurological:     Mental  Status: She is alert.     GCS: GCS eye subscore is 4. GCS verbal subscore is 5. GCS motor subscore is 6.     Cranial Nerves: No cranial nerve deficit.     Sensory: No sensory deficit.     Deep Tendon Reflexes: Reflexes are normal and symmetric.  Psychiatric:        Speech: Speech normal.        Behavior: Behavior normal. Behavior is cooperative.      Assessment and Plan:   Zayanna was seen today for establish care and annual exam.  Diagnoses and all orders for this visit:  Routine physical examination with abnormal findings Today patient counseled on age appropriate routine health concerns for screening and prevention, each reviewed and up to date or declined. Immunizations reviewed and up to date or declined. Labs ordered and reviewed. Risk factors for depression reviewed and negative. Hearing function and visual acuity are intact. ADLs screened and addressed as needed. Functional ability and level of safety reviewed and appropriate. Education, counseling and referrals performed based on assessed risks today. Patient provided with a copy of personalized plan for preventive services. -     CBC with Differential/Platelet -     Comprehensive metabolic panel  Encounter for lipid screening for cardiovascular disease; Family history of heart disease Will check lipid panel and update ASCVD. Also, will order CT cardiac scoring -- discussed with patient and she is agreeable. She is planning to contact her insurance company to inquire the cost of this. -     Lipid panel -     CT CARDIAC SCORING; Future  Encounter for screening laboratory testing for COVID-19 virus in asymptomatic patient I discussed with patient limitations of interpreting testing results and she is agreeable to proceed  with testing. -     SAR CoV2 Serology (COVID 19)AB(IGG)IA  Vitamin D deficiency -     VITAMIN D 25 Hydroxy (Vit-D Deficiency, Fractures)  . Reviewed expectations re: course of current medical  issues. . Discussed self-management of symptoms. . Outlined signs and symptoms indicating need for more acute intervention. . Patient verbalized understanding and all questions were answered. . See orders for this visit as documented in the electronic medical record. . Patient received an After-Visit Summary.  CMA or LPN served as scribe during this visit. History, Physical, and Plan performed by medical provider. The above documentation has been reviewed and is accurate and complete.   Inda Coke, PA-C

## 2019-11-21 LAB — SAR COV2 SEROLOGY (COVID19)AB(IGG),IA: SARS CoV2 AB IGG: POSITIVE — AB

## 2019-11-22 ENCOUNTER — Other Ambulatory Visit: Payer: Self-pay | Admitting: Physician Assistant

## 2019-11-22 MED ORDER — SIMVASTATIN 40 MG PO TABS: 40.0000 mg | ORAL_TABLET | Freq: Every day | ORAL | 1 refills | Status: DC

## 2019-12-04 ENCOUNTER — Encounter: Payer: Self-pay | Admitting: Physician Assistant

## 2019-12-05 ENCOUNTER — Other Ambulatory Visit: Payer: Self-pay

## 2019-12-05 ENCOUNTER — Ambulatory Visit (INDEPENDENT_AMBULATORY_CARE_PROVIDER_SITE_OTHER)
Admission: RE | Admit: 2019-12-05 | Discharge: 2019-12-05 | Disposition: A | Discharge status: Home / Self Care | Payer: Self-pay | Source: Ambulatory Visit | Attending: Physician Assistant | Admitting: Physician Assistant

## 2019-12-05 DIAGNOSIS — Z13220 Encounter for screening for lipoid disorders: Secondary | ICD-10-CM

## 2019-12-05 DIAGNOSIS — Z136 Encounter for screening for cardiovascular disorders: Secondary | ICD-10-CM

## 2019-12-06 ENCOUNTER — Encounter: Payer: Self-pay | Admitting: Physician Assistant

## 2019-12-06 ENCOUNTER — Other Ambulatory Visit: Payer: Self-pay | Admitting: Physician Assistant

## 2019-12-06 DIAGNOSIS — I2583 Coronary atherosclerosis due to lipid rich plaque: Secondary | ICD-10-CM

## 2019-12-06 DIAGNOSIS — I2510 Atherosclerotic heart disease of native coronary artery without angina pectoris: Secondary | ICD-10-CM | POA: Insufficient documentation

## 2019-12-06 MED ORDER — ATORVASTATIN CALCIUM 40 MG PO TABS: 40.0000 mg | ORAL_TABLET | Freq: Every day | ORAL | 1 refills | Status: DC

## 2019-12-06 MED ORDER — ROSUVASTATIN CALCIUM 20 MG PO TABS: 20.0000 mg | ORAL_TABLET | Freq: Every day | ORAL | 0 refills | Status: DC

## 2019-12-06 NOTE — Progress Notes (Signed)
Please make sure patient received mychart message and see if she has any questions:  "Your coronary calcium score is elevated, meaning that you have increased amount of plaque in your heart arteries compared to others in your age group.  Things that we are going to do: 1. Start daily low dose (81 mg) aspirin. This is available over the counter. 2. Change your cholesterol medication to a more potent medication. We will stop Zocor 40mg  and start Lipitor 40 mg. 3. Lastly,  I would like to send you to cardiology for further evaluation to keep you healthy. Please let me know if you are agreeable to this and I will happily put this referral in for you."

## 2019-12-06 NOTE — Progress Notes (Signed)
c 

## 2019-12-25 ENCOUNTER — Ambulatory Visit: Payer: 59 | Admitting: Cardiovascular Disease

## 2019-12-25 ENCOUNTER — Other Ambulatory Visit: Payer: Self-pay

## 2019-12-25 ENCOUNTER — Encounter: Payer: Self-pay | Admitting: Cardiovascular Disease

## 2019-12-25 VITALS — BP 109/65 | HR 68 | Temp 97.7°F | Ht 66.0 in | Wt 153.4 lb

## 2019-12-25 DIAGNOSIS — E785 Hyperlipidemia, unspecified: Secondary | ICD-10-CM | POA: Diagnosis not present

## 2019-12-25 DIAGNOSIS — E7800 Pure hypercholesterolemia, unspecified: Secondary | ICD-10-CM | POA: Diagnosis not present

## 2019-12-25 DIAGNOSIS — R002 Palpitations: Secondary | ICD-10-CM | POA: Diagnosis not present

## 2019-12-25 MED ORDER — ROSUVASTATIN CALCIUM 20 MG PO TABS: 20.0000 mg | ORAL_TABLET | Freq: Every day | ORAL | 3 refills | Status: DC

## 2019-12-25 NOTE — Patient Instructions (Signed)
Medication Instructions:  Start taking 20mg  Crestor Daily  If you need a refill on your cardiac medications before your next appointment, please call your pharmacy.   Lab work: Fasting Lipid and Hepatic Function in 2 months.  If you have labs (blood work) drawn today and your tests are completely normal, you will receive your results only by: [place] (if you have MyChart) OR A paper copy in the mail If you have any lab test that is abnormal or we need to change your treatment, we will call you to review the results.  Testing/Procedures: NONE  Follow-Up: At [facility], you and your health needs are our priority.  As part of our continuing mission to provide you with exceptional heart care, we have created designated Provider Care Teams.  These Care Teams include your primary Cardiologist (physician) and Advanced Practice Providers (APPs -  Physician Assistants and Nurse Practitioners) who all work together to provide you with the care you need, when you need it. You may see Dr Gwenlyn Found or one of the following Advanced Practice Providers on your designated Care Team:    Kerin Ransom, PA-C  [place], [place]  Coletta Memos, [place]  Your physician wants you to follow-up in: 3 months with Dr Gwenlyn Found

## 2019-12-25 NOTE — Addendum Note (Signed)
Addended by: Cain Sieve on: 12/25/2019 10:17 AM   Modules accepted: Orders

## 2019-12-25 NOTE — Assessment & Plan Note (Signed)
Kylie Miller complains of occasional fluttering in her chest.  She does drink over 4 cups of caffeine a day.  We talked about limiting her caffeine intake prior to embarking on a work-up for arrhythmia.

## 2019-12-25 NOTE — Progress Notes (Signed)
12/25/2019 Kylie Miller   [birth year]  [id number]  Primary Physician Inda Coke, PA Primary Cardiologist: Lorretta Harp MD [name], [place]  HPI:  Kylie Miller is a 54 y.o. 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  She was referred by Inda Coke, PA-C for elevated coronary calcium score.  Her only risk factor for CAD is hyperlipidemia on statin therapy.  She does not smoke.  She drinks wine daily.  There is no family history for heart disease.  She is never had a heart tach or stroke.  She denies chest pain or shortness of breath.  Recent coronary calcium score performed 12/05/2019 was 113.  Her most recent LDL on simvastatin 20 was 145 on 11/20/2019.  She complains of occasional "flutters "" in her chest although she does drink over 4 cups of caffeinated beverages a day.   Current Meds  Medication Sig  . ALPRAZolam (XANAX) 0.5 MG tablet Take 1 tablet (0.5 mg total) by mouth at bedtime as needed for anxiety.  . calcium carbonate (OS-CAL) 1250 (500 Ca) MG chewable tablet Chew 1 tablet by mouth daily.  . ciprofloxacin (CIPRO) 500 MG tablet Take 1 tablet (500 mg total) by mouth 2 (two) times daily.  Marland Kitchen glucosamine-chondroitin 500-400 MG tablet Take 1 tablet by mouth at bedtime.  . Multiple Vitamins-Minerals (MULTIVITAMIN WITH MINERALS) tablet Take 1 tablet by mouth daily.  . ondansetron (ZOFRAN) 4 MG tablet Take 1 tablet (4 mg total) by mouth every 8 (eight) hours as needed for nausea or vomiting.  . simvastatin (ZOCOR) 40 MG tablet Take 40 mg by mouth daily.  . Vitamin D, Cholecalciferol, 10 MCG (400 UNIT) CAPS Take 2 capsules by mouth daily.  . [DISCONTINUED] rosuvastatin (CRESTOR) 20 MG tablet Take 1 tablet (20 mg total) by mouth daily.     Allergies  Allergen Reactions  . Codeine     REACTION: nausea    Social History    Socioeconomic History  . Marital status: Married    Spouse name: david   . Number of children: 2  . Years of education: Not on file  . Highest education level: Not on file  Occupational History  . Occupation: Armed forces technical officer: [employer]  Tobacco Use  . Smoking status: Never Smoker  . Smokeless tobacco: Never Used  Substance and Sexual Activity  . Alcohol use: Yes    Comment: 1 or less  . Drug use: No  . Sexual activity: Not on file  Other Topics Concern  . Not on file  Social History Narrative  . Not on file   Social Determinants of Health   Financial Resource Strain:   . Difficulty of Paying Living Expenses: Not on file  Food Insecurity:   . Worried About Charity fundraiser in the Last Year: Not on file  . Ran Out of Food in the Last Year: Not on file  Transportation Needs:   . Lack of Transportation (Medical): Not on file  . Lack of Transportation (Non-Medical): Not on file  Physical Activity:   . Days of Exercise per Week: Not on file  . Minutes of Exercise per Session: Not on file  Stress:   . Feeling of Stress : Not on file  Social Connections:   . Frequency of Communication with Friends and Family: Not on file  .  Frequency of Social Gatherings with Friends and Family: Not on file  . Attends Religious Services: Not on file  . Active Member of Clubs or Organizations: Not on file  . Attends Archivist Meetings: Not on file  . Marital Status: Not on file  Intimate Partner Violence:   . Fear of Current or Ex-Partner: Not on file  . Emotionally Abused: Not on file  . Physically Abused: Not on file  . Sexually Abused: Not on file     Review of Systems: General: negative for chills, fever, night sweats or weight changes.  Cardiovascular: negative for chest pain, dyspnea on exertion, edema, orthopnea, palpitations, paroxysmal nocturnal dyspnea or shortness of breath Dermatological: negative for rash Respiratory: negative for cough  or wheezing Urologic: negative for hematuria Abdominal: negative for nausea, vomiting, diarrhea, bright red blood per rectum, melena, or hematemesis Neurologic: negative for visual changes, syncope, or dizziness All other systems reviewed and are otherwise negative except as noted above.    Blood pressure 109/65, pulse 68, temperature 97.7 F (36.5 C), height 5\' 6"  (1.676 m), weight 153 lb 6.4 oz (69.6 kg), SpO2 99 %.  General appearance: alert and no distress Neck: no adenopathy, no carotid bruit, no JVD, supple, symmetrical, trachea midline and thyroid not enlarged, symmetric, no tenderness/mass/nodules Lungs: clear to auscultation bilaterally Heart: regular rate and rhythm, S1, S2 normal, no murmur, click, rub or gallop Extremities: extremities normal, atraumatic, no cyanosis or edema Pulses: 2+ and symmetric Skin: Skin color, texture, turgor normal. No rashes or lesions Neurologic: Alert and oriented X 3, normal strength and tone. Normal symmetric reflexes. Normal coordination and gait  EKG sinus rhythm at 88 with RSR prime in lead V1 V2 suggesting RV conduction delay.  I personally reviewed this EKG.  ASSESSMENT AND PLAN:   HYPERCHOLESTEROLEMIA History of hyperlipidemia on simvastatin 40 mg which recently was increased from 20 mg a lipid profile performed 11/20/2019 revealing total cholesterol 254, LDL 145 and HDL of 94.  We will switch her to Crestor 20.  Our goal is to achieve an LDL somewhere between 70 and 90.  CAD (coronary artery disease) Recent coronary calcium score of 113 performed 12/05/2019.  She denies chest pain or shortness of breath.  She is very active.  She has minimal risk factors only hyperlipidemia.  I am going to change her simvastatin to rosuvastatin to try to further address her hyper lipidemia.  Palpitations Ms. Shuford complains of occasional fluttering in her chest.  She does drink over 4 cups of caffeine a day.  We talked about limiting her caffeine intake  prior to embarking on a work-up for arrhythmia.      Lorretta Harp MD FACP,FACC,FAHA, [facility] 12/25/2019 10:14 AM

## 2019-12-25 NOTE — Assessment & Plan Note (Signed)
History of hyperlipidemia on simvastatin 40 mg which recently was increased from 20 mg a lipid profile performed 11/20/2019 revealing total cholesterol 254, LDL 145 and HDL of 94.  We will switch her to Crestor 20.  Our goal is to achieve an LDL somewhere between 70 and 90.

## 2019-12-25 NOTE — Assessment & Plan Note (Signed)
Recent coronary calcium score of 113 performed 12/05/2019.  She denies chest pain or shortness of breath.  She is very active.  She has minimal risk factors only hyperlipidemia.  I am going to change her simvastatin to rosuvastatin to try to further address her hyper lipidemia.

## 2020-03-03 LAB — LIPID PANEL
Chol/HDL Ratio: 2.1 ratio (ref 0.0–4.4)
Cholesterol, Total: 179 mg/dL (ref 100–199)
HDL: 84 mg/dL (ref >39)
LDL Chol Calc (NIH): 82 mg/dL (ref 0–99)
Triglycerides: 71 mg/dL (ref 0–149)
VLDL Cholesterol Cal: 13 mg/dL (ref 5–40)

## 2020-03-03 LAB — HEPATIC FUNCTION PANEL
ALT: 27 IU/L (ref 0–32)
AST: 32 IU/L (ref 0–40)
Albumin: 4.9 g/dL (ref 3.8–4.9)
Alkaline Phosphatase: 75 IU/L (ref 39–117)
Bilirubin Total: 0.4 mg/dL (ref 0.0–1.2)
Bilirubin, Direct: 0.12 mg/dL (ref 0.00–0.40)
Total Protein: 7.6 g/dL (ref 6.0–8.5)

## 2020-03-24 ENCOUNTER — Other Ambulatory Visit: Payer: Self-pay

## 2020-03-24 ENCOUNTER — Encounter: Payer: Self-pay | Admitting: Cardiovascular Disease

## 2020-03-24 ENCOUNTER — Ambulatory Visit: Payer: 59 | Admitting: Cardiovascular Disease

## 2020-03-24 DIAGNOSIS — E7800 Pure hypercholesterolemia, unspecified: Secondary | ICD-10-CM | POA: Diagnosis not present

## 2020-03-24 DIAGNOSIS — I2510 Atherosclerotic heart disease of native coronary artery without angina pectoris: Secondary | ICD-10-CM

## 2020-03-24 DIAGNOSIS — R002 Palpitations: Secondary | ICD-10-CM | POA: Diagnosis not present

## 2020-03-24 DIAGNOSIS — I2583 Coronary atherosclerosis due to lipid rich plaque: Secondary | ICD-10-CM

## 2020-03-24 NOTE — Assessment & Plan Note (Signed)
History of hyperlipidemia previously on simvastatin and change to rosuvastatin with recent lipid profile performed 03/03/2020 revealing total cholesterol 179, LDL of 82 and HDL of 84.

## 2020-03-24 NOTE — Assessment & Plan Note (Signed)
Coronary calcium score is 113 without clinical evidence of CAD performed 12/05/2019.

## 2020-03-24 NOTE — Assessment & Plan Note (Signed)
History of palpitations improved with reduction in caffeine intake.

## 2020-03-24 NOTE — Progress Notes (Signed)
03/24/2020 Kylie Miller   08/17/1966  [id number]  Primary Physician Kylie Coke, PA Primary Cardiologist: Kylie Harp MD Kylie Miller, [place]  HPI:  Kylie Miller is a 54 y.o.  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  She was referred by Kylie Coke, PA-C for elevated coronary calcium score.  I last saw her in the office 12/25/2019. Her only risk factor for CAD is hyperlipidemia on statin therapy.  She does not smoke.  She drinks wine daily.  There is no family history for heart disease.  She is never had a heart tach or stroke.  She denies chest pain or shortness of breath.  Recent coronary calcium score performed 12/05/2019 was 113.  Her most recent LDL on simvastatin 20 was 145 on 11/20/2019.  She complains of occasional "flutters "" in her chest although she does drink over 4 cups of caffeinated beverages a day.  Since I saw her last she has decreased her caffeine intake at my request her palpitations have resolved.  She did change her simvastatin to rosuvastatin because of elevated LDL in the setting of moderately elevated coronary calcium score which resulted in marked improvement in her lipid profile.  This was performed 03/03/2020 revealing a total cholesterol of 179, LDL of 82 and HDL of 84.   Current Meds  Medication Sig  . ACETYLCARNITINE PO Take 1 tablet by mouth daily.  Marland Kitchen ALPRAZolam (XANAX) 0.5 MG tablet Take 1 tablet (0.5 mg total) by mouth at bedtime as needed for anxiety.  . calcium carbonate (OS-CAL) 1250 (500 Ca) MG chewable tablet Chew 1 tablet by mouth daily.  . ciprofloxacin (CIPRO) 500 MG tablet Take 1 tablet (500 mg total) by mouth 2 (two) times daily.  Marland Kitchen glucosamine-chondroitin 500-400 MG tablet Take 1 tablet by mouth at bedtime.  . Multiple Vitamins-Minerals (MULTIVITAMIN WITH MINERALS) tablet Take 1 tablet by mouth daily.    . ondansetron (ZOFRAN) 4 MG tablet Take 1 tablet (4 mg total) by mouth every 8 (eight) hours as needed for nausea or vomiting.  . rosuvastatin (CRESTOR) 20 MG tablet Take 1 tablet (20 mg total) by mouth daily.  . simvastatin (ZOCOR) 40 MG tablet Take 40 mg by mouth daily.  . Vitamin D, Cholecalciferol, 10 MCG (400 UNIT) CAPS Take 2 capsules by mouth daily.     Allergies  Allergen Reactions  . Codeine     REACTION: nausea    Social History   Socioeconomic History  . Marital status: Married    Spouse name: Kylie Miller   . Number of children: 2  . Years of education: Not on file  . Highest education level: Not on file  Occupational History  . Occupation: Armed forces technical officer: [employer]  Tobacco Use  . Smoking status: Never Smoker  . Smokeless tobacco: Never Used  Substance and Sexual Activity  . Alcohol use: Yes    Comment: 1 or less  . Drug use: No  . Sexual activity: Not on file  Other Topics Concern  . Not on file  Social History Narrative  . Not on file   Social Determinants of Health   Financial Resource Strain:   . Difficulty of Paying Living Expenses:   Food Insecurity:   . Worried About Charity fundraiser in the Last Year:   . [employer]  in the Last Year:   Transportation Needs:   . Film/video editor (Medical):   Marland Kitchen Lack of Transportation (Non-Medical):   Physical Activity:   . Days of Exercise per Week:   . Minutes of Exercise per Session:   Stress:   . Feeling of Stress :   Social Connections:   . Frequency of Communication with Friends and Family:   . Frequency of Social Gatherings with Friends and Family:   . Attends Religious Services:   . Active Member of Clubs or Organizations:   . Attends Archivist Meetings:   Marland Kitchen Marital Status:   Intimate Partner Violence:   . Fear of Current or Ex-Partner:   . Emotionally Abused:   Marland Kitchen Physically Abused:   . Sexually Abused:      Review of Systems: General: negative for  chills, fever, night sweats or weight changes.  Cardiovascular: negative for chest pain, dyspnea on exertion, edema, orthopnea, palpitations, paroxysmal nocturnal dyspnea or shortness of breath Dermatological: negative for rash Respiratory: negative for cough or wheezing Urologic: negative for hematuria Abdominal: negative for nausea, vomiting, diarrhea, bright red blood per rectum, melena, or hematemesis Neurologic: negative for visual changes, syncope, or dizziness All other systems reviewed and are otherwise negative except as noted above.    Blood pressure 98/62, pulse 64, height 5\' 6"  (1.676 m), weight 146 lb (66.2 kg).  General appearance: alert and no distress Neck: no adenopathy, no carotid bruit, no JVD, supple, symmetrical, trachea midline and thyroid not enlarged, symmetric, no tenderness/mass/nodules Lungs: clear to auscultation bilaterally Heart: regular rate and rhythm, S1, S2 normal, no murmur, click, rub or gallop Extremities: extremities normal, atraumatic, no cyanosis or edema Pulses: 2+ and symmetric Skin: Skin color, texture, turgor normal. No rashes or lesions Neurologic: Alert and oriented X 3, normal strength and tone. Normal symmetric reflexes. Normal coordination and gait  EKG not performed today  ASSESSMENT AND PLAN:   HYPERCHOLESTEROLEMIA History of hyperlipidemia previously on simvastatin and change to rosuvastatin with recent lipid profile performed 03/03/2020 revealing total cholesterol 179, LDL of 82 and HDL of 84.  Palpitations History of palpitations improved with reduction in caffeine intake.  CAD (coronary artery disease) Coronary calcium score is 113 without clinical evidence of CAD performed 12/05/2019.      Kylie Harp MD FACP,FACC,FAHA, [facility] 03/24/2020 9:19 AM

## 2020-03-24 NOTE — Patient Instructions (Signed)
Your physician recommends that you continue on your current medications as directed. Please refer to the Current Medication list given to you today.   Your physician wants you to follow-up in: [place] will receive a reminder letter in the mail two months in advance. If you don't receive a letter, please call our office to schedule the follow-up appointment.

## 2020-06-03 ENCOUNTER — Other Ambulatory Visit: Payer: Self-pay

## 2020-06-03 ENCOUNTER — Encounter: Payer: Self-pay | Admitting: Family Medicine

## 2020-06-03 ENCOUNTER — Ambulatory Visit (INDEPENDENT_AMBULATORY_CARE_PROVIDER_SITE_OTHER): Payer: 59 | Admitting: Family Medicine

## 2020-06-03 ENCOUNTER — Encounter: Payer: Self-pay | Admitting: Physician Assistant

## 2020-06-03 VITALS — BP 110/68 | HR 74 | Temp 97.8°F | Wt 142.0 lb

## 2020-06-03 DIAGNOSIS — L299 Pruritus, unspecified: Secondary | ICD-10-CM

## 2020-06-03 DIAGNOSIS — M25471 Effusion, right ankle: Secondary | ICD-10-CM

## 2020-06-03 DIAGNOSIS — T63481A Toxic effect of venom of other arthropod, accidental (unintentional), initial encounter: Secondary | ICD-10-CM | POA: Diagnosis not present

## 2020-06-03 DIAGNOSIS — IMO0001 Reserved for inherently not codable concepts without codable children: Secondary | ICD-10-CM

## 2020-06-03 NOTE — Patient Instructions (Signed)
Bee, Wasp, or [employer], Adult Bees, wasps, and hornets are part of a family of insects that can sting people. These stings can cause pain and inflammation, but they are usually not serious. However, some people may have an allergic reaction to a sting. This can cause the symptoms to be more severe. What increases the risk? You may be at a greater risk of getting stung if you:  Provoke a stinging insect by swatting or disturbing it.  Wear strong-smelling soaps, deodorants, or body sprays.  Spend time outdoors near gardens with flowers or fruit trees or in clothes that expose skin.  Eat or drink outside. What are the signs or symptoms? Common symptoms of this condition include:  A red lump in the skin that sometimes has a tiny hole in the center. In some cases, a stinger may be in the center of the wound.  Pain and itching at the sting site.  Redness and swelling around the sting site. If you have an allergic reaction (localized allergic reaction), the swelling and redness may spread out from the sting site. In some cases, this reaction can continue to develop over the next 24-48 hours. In rare cases, a person may have a severe allergic reaction (anaphylactic reaction) to a sting. Symptoms of an anaphylactic reaction may include:  Wheezing or difficulty breathing.  Raised, itchy, red patches on the skin (hives).  Nausea or vomiting.  Abdominal cramping.  Diarrhea.  Tightness in the chest or chest pain.  Dizziness or fainting.  Redness of the face (flushing).  Hoarse voice.  Swollen tongue, lips, or face. How is this diagnosed? This condition is usually diagnosed based on your symptoms and medical history as well as a physical exam. You may have an allergy test to determine if you are allergic to the substance that the insect injected during the sting (venom). How is this treated? If you were stung by a bee, the stinger and a small sac of venom may be in the wound. It is  important to remove the stinger as soon as possible. You can do this by brushing across the wound with gauze, a fingernail, or a flat card such as a credit card. Removing the stinger can help reduce the severity of your body's reaction to the sting. Most stings can be treated with:  Icing to reduce swelling in the area.  Medicines (antihistamines) to treat itching or an allergic reaction.  Medicines to help reduce pain. These may be medicines that you take by mouth, or medicated creams or lotions that you apply to your skin. Pay close attention to your symptoms after you have been stung. If possible, have someone stay with you to make sure you do not have an allergic reaction. If you have any signs of an allergic reaction, call your health care provider. If you have ever had a severe allergic reaction, your health care provider may give you an inhaler or injectable medicine (epinephrine auto-injector) to use if necessary. Follow these instructions at home:   Wash the sting site 2-3 times each day with soap and water as told by your health care provider.  Apply or take over-the-counter and prescription medicines only as told by your health care provider.  If directed, apply ice to the sting area. ? Put ice in a plastic bag. ? Place a towel between your skin and the bag. ? Leave the ice on for 20 minutes, 2-3 times a day.  Do not scratch the sting area.  If  you had a severe allergic reaction to a sting, you may need: ? To wear a medical bracelet or necklace that lists the allergy. ? To learn when and how to use an anaphylaxis kit or epinephrine injection. Your family members and coworkers may also need to learn this. ? To carry an anaphylaxis kit or epinephrine injection with you at all times. How is this prevented?  Avoid swatting at stinging insects and disturbing insect nests.  Do not use fragrant soaps or lotions.  Wear shoes, pants, and long sleeves when spending time outdoors,  especially in grassy areas where stinging insects are common.  Keep outdoor areas free from nests or hives.  Keep food and drink containers covered when eating outdoors.  Avoid working or sitting near flowering plants, if possible.  Wear gloves if you are gardening or working outdoors.  If an attack by a stinging insect or a swarm seems likely in the moment, move away from the area or find a barrier between you and the insect(s), such as a door. Contact a health care provider if:  Your symptoms do not get better in 2-3 days.  You have redness, swelling, or pain that spreads beyond the area of the sting.  You have a fever. Get help right away if: You have symptoms of a severe allergic reaction. These include:  Wheezing or difficulty breathing.  Tightness in the chest or chest pain.  Light-headedness or fainting.  Itchy, raised, red patches on the skin.  Nausea or vomiting.  Abdominal cramping.  Diarrhea.  A swollen tongue or lips, or trouble swallowing.  Dizziness or fainting. Summary  Stings from bees, wasps, and hornets can cause pain and inflammation, but they are usually not serious. However, some people may have an allergic reaction to a sting. This can cause the symptoms to be more severe.  Pay close attention to your symptoms after you have been stung. If possible, have someone stay with you to make sure you do not have an allergic reaction.  Call your health care provider if you have any signs of an allergic reaction. This information is not intended to replace advice given to you by your health care provider. Make sure you discuss any questions you have with your health care provider. Document Revised: 11/30/2017 Document Reviewed: 02/09/2017 Elsevier Patient Education  2020 Elsevier Inc.  

## 2020-06-03 NOTE — Progress Notes (Signed)
Subjective:    Patient ID: Kylie Miller, female    DOB: 09/19/1966, 54 y.o.   MRN: [id number]  No chief complaint on file.   HPI  Pt is typically seen at [facility], but seen today for acute concern.  Pt thinks she was bitten by a bee on Monday, mid day.  Pt did not see the insect, but notes others were around.  Pt tried benadryl and ice.  Pt endorses R ankle edema, itching, warmth, and "pus pockets".    Past Medical History:  Diagnosis Date  . Allergic rhinitis   . Anxiety   . Family history of breast cancer   . Family history of colon cancer   . Family history of pancreatic cancer   . Family history of thyroid cancer   . Hyperlipidemia    family history  . IBS (irritable bowel syndrome)    resolved  . Lumbar back pain   . Vaginal delivery 1992    Allergies  Allergen Reactions  . Codeine     REACTION: nausea    ROS General: Denies fever, chills, night sweats, changes in weight, changes in appetite HEENT: Denies headaches, ear pain, changes in vision, rhinorrhea, sore throat CV: Denies CP, palpitations, SOB, orthopnea Pulm: Denies SOB, cough, wheezing GI: Denies abdominal pain, nausea, vomiting, diarrhea, constipation GU: Denies dysuria, hematuria, frequency, vaginal discharge Msk: Denies muscle cramps, joint pains Neuro: Denies weakness, numbness, tingling Skin: Denies rashes, bruising  +insect bite, R ankle edema Psych: Denies depression, anxiety, hallucinations     Objective:    Blood pressure 110/68, pulse 74, temperature 97.8 F (36.6 C), temperature source Temporal, weight 142 lb (64.4 kg), SpO2 97 %.  Gen. Pleasant, well-nourished, in no distress, normal affect   HEENT: [place]/AT, face symmetric, no scleral icterus, PERRLA, EOMI, nares patent without drainage Lungs: no accessory muscle use Cardiovascular: RRR Neuro:  A&Ox3, CN II-XII intact, normal gait Skin:  Warm, dry, intact.  R lateral ankle with several small flesh colored vesicles, edema, and slightly  increased warmth. No erythema or drainage noted.   Wt Readings from Last 3 Encounters:  06/03/20 142 lb (64.4 kg)  03/24/20 146 lb (66.2 kg)  12/25/19 153 lb 6.4 oz (69.6 kg)    Lab Results  Component Value Date   WBC 5.2 11/20/2019   HGB 12.7 11/20/2019   HCT 37.5 11/20/2019   PLT 319.0 11/20/2019   GLUCOSE 93 11/20/2019   CHOL 179 03/03/2020   TRIG 71 03/03/2020   HDL 84 03/03/2020   LDLDIRECT 140.6 04/16/2008   LDLCALC 82 03/03/2020   ALT 27 03/03/2020   AST 32 03/03/2020   NA 140 11/20/2019   K 4.3 11/20/2019   CL 103 11/20/2019   CREATININE 0.73 11/20/2019   BUN 16 11/20/2019   CO2 29 11/20/2019   TSH 4.83 (H) 09/11/2018    Assessment/Plan:  Hymenoptera sting, accidental or unintentional, initial encounter -given handout -continue supportive care. -no s/s of infection noted. -continue to monitor -given precautions  Edema of right ankle -continue supportive care, ice, elevation  Pruritus -zyrtec.  Offered rx, pt has at home.  F/u with pcp  Grier Mitts, MD

## 2020-06-16 ENCOUNTER — Other Ambulatory Visit: Payer: Self-pay

## 2020-06-16 ENCOUNTER — Encounter: Payer: Self-pay | Admitting: Physician Assistant

## 2020-06-16 MED ORDER — ROSUVASTATIN CALCIUM 20 MG PO TABS: 20.0000 mg | ORAL_TABLET | Freq: Every day | ORAL | 3 refills | Status: DC

## 2020-07-28 DIAGNOSIS — Z90710 Acquired absence of both cervix and uterus: Secondary | ICD-10-CM | POA: Insufficient documentation

## 2020-11-27 ENCOUNTER — Ambulatory Visit (INDEPENDENT_AMBULATORY_CARE_PROVIDER_SITE_OTHER): Payer: 59 | Admitting: Physician Assistant

## 2020-11-27 ENCOUNTER — Other Ambulatory Visit: Payer: Self-pay

## 2020-11-27 ENCOUNTER — Encounter: Payer: Self-pay | Admitting: Physician Assistant

## 2020-11-27 VITALS — BP 116/60 | HR 69 | Temp 98.0°F | Resp 18 | Ht 66.0 in | Wt 147.0 lb

## 2020-11-27 DIAGNOSIS — Z0001 Encounter for general adult medical examination with abnormal findings: Secondary | ICD-10-CM

## 2020-11-27 DIAGNOSIS — R040 Epistaxis: Secondary | ICD-10-CM

## 2020-11-27 DIAGNOSIS — E7800 Pure hypercholesterolemia, unspecified: Secondary | ICD-10-CM

## 2020-11-27 DIAGNOSIS — R0989 Other specified symptoms and signs involving the circulatory and respiratory systems: Secondary | ICD-10-CM | POA: Diagnosis not present

## 2020-11-27 LAB — LIPID PANEL
Cholesterol: 198 mg/dL (ref <200)
HDL: 83 mg/dL (ref >=50)
LDL Cholesterol (Calc): 100 mg/dL (calc) — ABNORMAL HIGH
Non-HDL Cholesterol (Calc): 115 mg/dL (calc) (ref <130)
Total CHOL/HDL Ratio: 2.4 (calc) (ref <5.0)
Triglycerides: 68 mg/dL (ref <150)

## 2020-11-27 LAB — COMPREHENSIVE METABOLIC PANEL
AG Ratio: 1.5 (calc) (ref 1.0–2.5)
ALT: 21 U/L (ref 6–29)
AST: 22 U/L (ref 10–35)
Albumin: 4.4 g/dL (ref 3.6–5.1)
Alkaline phosphatase (APISO): 69 U/L (ref 37–153)
BUN: 22 mg/dL (ref 7–25)
CO2: 28 mmol/L (ref 20–32)
Calcium: 9.9 mg/dL (ref 8.6–10.4)
Chloride: 103 mmol/L (ref 98–110)
Creat: 0.64 mg/dL (ref 0.50–1.05)
Globulin: 3 g/dL (calc) (ref 1.9–3.7)
Glucose, Bld: 77 mg/dL (ref 65–99)
Potassium: 4.3 mmol/L (ref 3.5–5.3)
Sodium: 139 mmol/L (ref 135–146)
Total Bilirubin: 0.5 mg/dL (ref 0.2–1.2)
Total Protein: 7.4 g/dL (ref 6.1–8.1)

## 2020-11-27 LAB — CBC WITH DIFFERENTIAL/PLATELET
Absolute Monocytes: 514 cells/uL (ref 200–950)
Basophils Absolute: 58 cells/uL (ref 0–200)
Basophils Relative: 1.1 %
Eosinophils Absolute: 355 cells/uL (ref 15–500)
Eosinophils Relative: 6.7 %
HCT: 37.4 % (ref 35.0–45.0)
Hemoglobin: 12.6 g/dL (ref 11.7–15.5)
Lymphs Abs: 1818 cells/uL (ref 850–3900)
MCH: 31 pg (ref 27.0–33.0)
MCHC: 33.7 g/dL (ref 32.0–36.0)
MCV: 92.1 fL (ref 80.0–100.0)
MPV: 10.1 fL (ref 7.5–12.5)
Monocytes Relative: 9.7 %
Neutro Abs: 2555 cells/uL (ref 1500–7800)
Neutrophils Relative %: 48.2 %
Platelets: 327 10*3/uL (ref 140–400)
RBC: 4.06 10*6/uL (ref 3.80–5.10)
RDW: 11.9 % (ref 11.0–15.0)
Total Lymphocyte: 34.3 %
WBC: 5.3 10*3/uL (ref 3.8–10.8)

## 2020-11-27 MED ORDER — MUPIROCIN 2 % EX OINT: 1.0000 "application " | TOPICAL_OINTMENT | Freq: Two times a day (BID) | CUTANEOUS | 0 refills | Status: DC

## 2020-11-27 NOTE — Progress Notes (Signed)
Subjective:    Kylie Miller is a 54 y.o. female and is here for a comprehensive physical exam.   HPI  Health Maintenance Due  Topic Date Due  . Hepatitis C Screening  Never done  . HIV Screening  Never done  . MAMMOGRAM  07/03/2020    Acute Concerns: Nasal sores and nosebleeds -- daily issues. Has tried: vaseline, humidifier, nasal sprays, all without significant relief of symptoms. Denies: profuse bleeding, lightheadedness/dizziness.  Congestion -- has lingered since her COVID infection last year. Has tried flonase, afrin, generic zyrtec all without significant relief. No prior smoking history but did have hx of significant smoke exposure when younger. Denies: hemoptysis, night sweats, LAD.  Chronic Issues: HLD -- taking crestor 20 mg daily. Tolerating without issues.  Health Maintenance: Immunizations -- UTD Colonoscopy -- UTD, last done March 2013, due in 2023 Mammogram -- UTD PAP -- s/p hysterectomy Bone Density -- July 2020 Diet -- overall healthy diet Caffeine intake -- 2-3 drinks coffee throughout the day Sleep habits -- no significant concerns, has trialed some medications with ob-gyn Exercise -- very active with helping husband's work Weight -- Weight: 147 lb (66.7 kg)  Mood -- no concerns Weight history: Wt Readings from Last 10 Encounters:  11/27/20 147 lb (66.7 kg)  06/03/20 142 lb (64.4 kg)  03/24/20 146 lb (66.2 kg)  12/25/19 153 lb 6.4 oz (69.6 kg)  11/20/19 149 lb 4 oz (67.7 kg)  09/11/18 150 lb 9.6 oz (68.3 kg)  09/07/17 151 lb (68.5 kg)  04/21/17 150 lb (68 kg)  09/07/16 153 lb (69.4 kg)  09/08/15 148 lb 12.8 oz (67.5 kg)   Body mass index is 23.73 kg/m. No LMP recorded. Patient has had a hysterectomy. Period characteristics: no vaginal bleeding Alcohol use: glass of wine nightly some times Tobacco use: none  Depression screen PHQ 2/9 11/27/2020  Decreased Interest 0  Down, Depressed, Hopeless 0  PHQ - 2 Score 0   UTD with dentist  and eye doctor  Other providers/specialists: Patient Care Team: Inda Coke, [place] as PCP - General (Physician Assistant) Lorretta Harp, MD as PCP - Cardiology (Cardiology) Everlene Farrier, MD as Consulting Physician (Obstetrics and Gynecology)    PMHx, SurgHx, SocialHx, Medications, and Allergies were reviewed in the Visit Navigator and updated as appropriate.   Past Medical History:  Diagnosis Date  . Allergic rhinitis   . Anxiety   . Family history of breast cancer   . Family history of colon cancer   . Family history of pancreatic cancer   . Family history of thyroid cancer   . Hyperlipidemia    family history  . IBS (irritable bowel syndrome)    resolved  . Lumbar back pain   . Vaginal delivery 1992     Past Surgical History:  Procedure Laterality Date  . CESAREAN SECTION  1996  . VAGINAL HYSTERECTOMY       Family History  Problem Relation Age of Onset  . Breast cancer Mother 53  . Pancreatic cancer Mother 37  . Skin cancer Father   . Dementia Father   . Colon cancer Paternal Uncle        dx late 60s-70  . Colon cancer Maternal Aunt        dx between 47-70  . Heart disease Maternal Grandfather   . Stomach cancer Paternal Grandfather 76  . Thyroid cancer Maternal Grandmother        dx in her 69s  .  Heart disease Paternal Aunt   . Esophageal cancer Paternal Grandmother 89  . Heart attack Paternal Grandmother 89  . Cancer Other        PGF's brother with unknown cancer  . Cancer Other        MGM's brother with unknown cancer    Social History   Tobacco Use  . Smoking status: Never Smoker  . Smokeless tobacco: Never Used  Substance Use Topics  . Alcohol use: Yes    Comment: 1 or less  . Drug use: No    Review of Systems:   Review of Systems  Constitutional: Negative for chills, fever, malaise/fatigue and weight loss.  HENT: Negative for hearing loss, sinus pain and sore throat.   Respiratory: Negative for cough and hemoptysis.    Cardiovascular: Negative for chest pain, palpitations, leg swelling and PND.  Gastrointestinal: Negative for abdominal pain, constipation, diarrhea, heartburn, nausea and vomiting.  Genitourinary: Negative for dysuria, frequency and urgency.  Musculoskeletal: Negative for back pain, myalgias and neck pain.  Skin: Negative for itching and rash.  Neurological: Negative for dizziness, tingling, seizures and headaches.  Endo/Heme/Allergies: Negative for polydipsia.  Psychiatric/Behavioral: Negative for depression. The patient is not nervous/anxious.     Objective:   BP 116/60   Pulse 69   Temp 98 F (36.7 C) (Temporal)   Resp 18   Ht 5\' 6"  (1.676 m)   Wt 147 lb (66.7 kg)   SpO2 96%   BMI 23.73 kg/m  Body mass index is 23.73 kg/m.   General Appearance:    Alert, cooperative, no distress, appears stated age  Head:    Normocephalic, without obvious abnormality, atraumatic  Eyes:    PERRL, conjunctiva/corneas clear, EOM's intact, fundi    benign, both eyes  Ears:    Normal TM's and external ear canals, both ears  Nose:   Nares normal, septum midline, mucosa normal, no drainage    or sinus tenderness  Throat:   Lips, mucosa, and tongue normal; teeth and gums normal  Neck:   Supple, symmetrical, trachea midline, no adenopathy;    thyroid:  no enlargement/tenderness/nodules; no carotid   bruit or JVD  Back:     Symmetric, no curvature, ROM normal, no CVA tenderness  Lungs:     Clear to auscultation bilaterally, respirations unlabored  Chest Wall:    No tenderness or deformity   Heart:    Regular rate and rhythm, S1 and S2 normal, no murmur, rub or gallop  Breast Exam:    Deferred  Abdomen:     Soft, non-tender, bowel sounds active all four quadrants,    no masses, no organomegaly  Genitalia:    Deferred  Extremities:   Extremities normal, atraumatic, no cyanosis or edema  Pulses:   2+ and symmetric all extremities  Skin:   Skin color, texture, turgor normal, no rashes or lesions   Lymph nodes:   Cervical, supraclavicular, and axillary nodes normal  Neurologic:   CNII-XII intact, normal strength, sensation and reflexes    throughout     Assessment/Plan:   Sarah was seen today for annual exam, nose sores and health maintenance.  Diagnoses and all orders for this visit:  Encounter for general adult medical examination with abnormal findings Today patient counseled on age appropriate routine health concerns for screening and prevention, each reviewed and up to date or declined. Immunizations reviewed and up to date or declined. Labs ordered and reviewed. Risk factors for depression reviewed and negative. Hearing function and  visual acuity are intact. ADLs screened and addressed as needed. Functional ability and level of safety reviewed and appropriate. Education, counseling and referrals performed based on assessed risks today. Patient provided with a copy of personalized plan for preventive services.  Epistaxis No red flags. Will trial mupirocin. Refer to ENT for further evaluation. -     Ambulatory referral to ENT  HYPERCHOLESTEROLEMIA Update lipid panel and adjust crestor 20 mg daily as indicated. -     Lipid panel; Future  Chest congestion Lung exam normal today. Recommend plain mucinex. Follow-up if symptoms worsen, may consider CXR vs pulm referral. -     CBC with Differential/Platelet; Future -     Comprehensive metabolic panel; Future  Other orders -     mupirocin ointment (BACTROBAN) 2 %; Place 1 application into the nose 2 (two) times daily.    Well Adult Exam: Labs ordered: Yes. Patient counseling was done. See below for items discussed. Discussed the patient's BMI.  The BMI is in the acceptable range Follow up in one year. Breast cancer screening: UTD. Cervical cancer screening: n/a   Patient Counseling: [x]    Nutrition: Stressed importance of moderation in sodium/caffeine intake, saturated fat and cholesterol, caloric balance, sufficient  intake of fresh fruits, vegetables, fiber, calcium, iron, and 1 mg of folate supplement per day (for females capable of pregnancy).  [x]    Stressed the importance of regular exercise.   [x]    Substance Abuse: Discussed cessation/primary prevention of tobacco, alcohol, or other drug use; driving or other dangerous activities under the influence; availability of treatment for abuse.   [x]    Injury prevention: Discussed safety belts, safety helmets, smoke detector, smoking near bedding or upholstery.   [x]    Sexuality: Discussed sexually transmitted diseases, partner selection, use of condoms, avoidance of unintended pregnancy  and contraceptive alternatives.  [x]    Dental health: Discussed importance of regular tooth brushing, flossing, and dental visits.  [x]    Health maintenance and immunizations reviewed. Please refer to Health maintenance section.   Inda Coke, PA-C [place]

## 2020-11-27 NOTE — Addendum Note (Signed)
Addended by: Jodell Cipro on: 11/27/2020 09:34 AM   Modules accepted: Orders

## 2020-11-27 NOTE — Patient Instructions (Addendum)
It was great to see you!  Trial daily nasal saline, followed by flonase. Do in AM and PM.  Use plain mucinex (no DM) for your congestion. Drink plenty of fluids.  Bactroban ointment as needed up to twice daily for nasal sores. ENT referral placed today.   Sleep Hygiene  Do: (1) Go to bed at the same time each day. (2) Get up from bed at the same time each day. (3) Get regular exercise each day, preferably in the morning.  There is goof evidence that regular exercise improves restful sleep.  This includes stretching and aerobic exercise. (4) Get regular exposure to outdoor or bright lights, especially in the late afternoon. (5) Keep the temperature in your bedroom comfortable. (6) Keep the bedroom quiet when sleeping. (7) Keep the bedroom dark enough to facilitate sleep. (8) Use your bed only for sleep and sex. (9) Take medications as directed.  It is helpful to take prescribed sleeping pills 1 hour before bedtime, so they are causing drowsiness when you lie down, or 10 hours before getting up, to avoid daytime drowsiness. (10) Use a relaxation exercise just before going to sleep -- imagery, massage, warm bath. (11) Keep your feet and hands warm.  Wear warm socks and/or mittens or gloves to bed.  Don't: (1) Exercise just before going to bed. (2) Engage in stimulating activity just before bed, such as playing a competitive game, watching an exciting program on television, or having an important discussion with a loved one. (3) Have caffeine in the evening (coffee, teas, chocolate, sodas, etc.) (4) Read or watch television in bed. (5) Use alcohol to help you sleep. (6) Go to bed too hungry or too full. (7) Take another person's sleeping pills. (8) Take over-the-counter sleeping pills, without your doctor's knowledge.  Tolerance can develop rapidly with these medications.  Diphenhydramine can have serious side effects for elderly patients. (9) Take daytime naps. (10) Command yourself to  go to sleep.  This only makes your mind and body more alert.  If you lie awake for more than 20-30 minutes, get up, go to a different room, participate in a quiet activity (Ex - non-excitable reading or television), and then return to bed when you feel sleepy.  Do this as many times during the night as needed.  This may cause you to have a night or two of poor sleep but it will train your brain to know when it is time for sleep.   Please go to the lab for blood work.   Our office will call you with your results unless you have chosen to receive results via MyChart.  If your blood work is normal we will follow-up each year for physicals and as scheduled for chronic medical problems.  If anything is abnormal we will treat accordingly and get you in for a follow-up.  Take care,  [facility] Maintenance, Female Adopting a healthy lifestyle and getting preventive care are important in promoting health and wellness. Ask your health care provider about:  The right schedule for you to have regular tests and exams.  Things you can do on your own to prevent diseases and keep yourself healthy. What should I know about diet, weight, and exercise? Eat a healthy diet   Eat a diet that includes plenty of vegetables, fruits, low-fat dairy products, and lean protein.  Do not eat a lot of foods that are high in solid fats, added sugars, or sodium. Maintain a healthy weight Body  mass index (BMI) is used to identify weight problems. It estimates body fat based on height and weight. Your health care provider can help determine your BMI and help you achieve or maintain a healthy weight. Get regular exercise Get regular exercise. This is one of the most important things you can do for your health. Most adults should:  Exercise for at least 150 minutes each week. The exercise should increase your heart rate and make you sweat (moderate-intensity exercise).  Do strengthening exercises at least  twice a week. This is in addition to the moderate-intensity exercise.  Spend less time sitting. Even light physical activity can be beneficial. Watch cholesterol and blood lipids Have your blood tested for lipids and cholesterol at 56 years of age, then have this test every 5 years. Have your cholesterol levels checked more often if:  Your lipid or cholesterol levels are high.  You are older than 56 years of age.  You are at high risk for heart disease. What should I know about cancer screening? Depending on your health history and family history, you may need to have cancer screening at various ages. This may include screening for:  Breast cancer.  Cervical cancer.  Colorectal cancer.  Skin cancer.  Lung cancer. What should I know about heart disease, diabetes, and high blood pressure? Blood pressure and heart disease  High blood pressure causes heart disease and increases the risk of stroke. This is more likely to develop in people who have high blood pressure readings, are of African descent, or are overweight.  Have your blood pressure checked: ? Every 3-5 years if you are 44-66 years of age. ? Every year if you are 53 years old or older. Diabetes Have regular diabetes screenings. This checks your fasting blood sugar level. Have the screening done:  Once every three years after age 74 if you are at a normal weight and have a low risk for diabetes.  More often and at a younger age if you are overweight or have a high risk for diabetes. What should I know about preventing infection? Hepatitis B If you have a higher risk for hepatitis B, you should be screened for this virus. Talk with your health care provider to find out if you are at risk for hepatitis B infection. Hepatitis C Testing is recommended for:  Everyone born from [birth year] through 1967.  Anyone with known risk factors for hepatitis C. Sexually transmitted infections (STIs)  Get screened for STIs, including  gonorrhea and chlamydia, if: ? You are sexually active and are younger than 56 years of age. ? You are older than 56 years of age and your health care provider tells you that you are at risk for this type of infection. ? Your sexual activity has changed since you were last screened, and you are at increased risk for chlamydia or gonorrhea. Ask your health care provider if you are at risk.  Ask your health care provider about whether you are at high risk for HIV. Your health care provider may recommend a prescription medicine to help prevent HIV infection. If you choose to take medicine to prevent HIV, you should first get tested for HIV. You should then be tested every 3 months for as long as you are taking the medicine. Pregnancy  If you are about to stop having your period (premenopausal) and you may become pregnant, seek counseling before you get pregnant.  Take 400 to 800 micrograms (mcg) of folic acid every day if you  become pregnant.  Ask for birth control (contraception) if you want to prevent pregnancy. Osteoporosis and menopause Osteoporosis is a disease in which the bones lose minerals and strength with aging. This can result in bone fractures. If you are 53 years old or older, or if you are at risk for osteoporosis and fractures, ask your health care provider if you should:  Be screened for bone loss.  Take a calcium or vitamin D supplement to lower your risk of fractures.  Be given hormone replacement therapy (HRT) to treat symptoms of menopause. Follow these instructions at home: Lifestyle  Do not use any products that contain nicotine or tobacco, such as cigarettes, e-cigarettes, and chewing tobacco. If you need help quitting, ask your health care provider.  Do not use street drugs.  Do not share needles.  Ask your health care provider for help if you need support or information about quitting drugs. Alcohol use  Do not drink alcohol if: ? Your health care provider  tells you not to drink. ? You are pregnant, may be pregnant, or are planning to become pregnant.  If you drink alcohol: ? Limit how much you use to 0-1 drink a day. ? Limit intake if you are breastfeeding.  Be aware of how much alcohol is in your drink. In the [place] one drink equals one 12 oz bottle of beer (355 mL), one 5 oz glass of wine (148 mL), or one 1 oz glass of hard liquor (44 mL). General instructions  Schedule regular health, dental, and eye exams.  Stay current with your vaccines.  Tell your health care provider if: ? You often feel depressed. ? You have ever been abused or do not feel safe at home. Summary  Adopting a healthy lifestyle and getting preventive care are important in promoting health and wellness.  Follow your health care provider's instructions about healthy diet, exercising, and getting tested or screened for diseases.  Follow your health care provider's instructions on monitoring your cholesterol and blood pressure. This information is not intended to replace advice given to you by your health care provider. Make sure you discuss any questions you have with your health care provider. Document Revised: 11/28/2018 Document Reviewed: 11/28/2018 Elsevier Patient Education  2020 [employer].

## 2020-12-07 ENCOUNTER — Encounter: Payer: Self-pay | Admitting: Physician Assistant

## 2020-12-07 ENCOUNTER — Other Ambulatory Visit: Payer: Self-pay

## 2020-12-07 ENCOUNTER — Ambulatory Visit (INDEPENDENT_AMBULATORY_CARE_PROVIDER_SITE_OTHER)
Admission: RE | Admit: 2020-12-07 | Discharge: 2020-12-07 | Disposition: A | Discharge status: Home / Self Care | Payer: 59 | Source: Ambulatory Visit | Attending: Physician Assistant | Admitting: Physician Assistant

## 2020-12-07 ENCOUNTER — Other Ambulatory Visit: Payer: Self-pay | Admitting: Physician Assistant

## 2020-12-07 DIAGNOSIS — R0989 Other specified symptoms and signs involving the circulatory and respiratory systems: Secondary | ICD-10-CM

## 2021-02-14 IMAGING — CT CT HEART SCORING
2 series · 16 of 20 positions shown, 18 images · non-contrast
Comparison: None.
COMPARISON: None.

Addendum:
EXAM:
OVER-READ INTERPRETATION  CT CHEST

The following report is an over-read performed by radiologist Dr.
Aczel Duron [REDACTED] on 12/05/2019. This
over-read does not include interpretation of cardiac or coronary
anatomy or pathology. The coronary calcium score interpretation by
the cardiologist is attached.
CLINICAL DATA: Risk stratification
Coronary Calcium Score
TECHNIQUE: The patient was scanned on a Siemens Force scanner. Axial
non-contrast 3 mm slices were carried out through the heart. The
data set was analyzed on a dedicated work station and scored using
the Agatson method.

[Series 2: casc 3.0 i36f 2 bestdiast 66 % · axial · 0.33mm/px · z∈[-226,-121]mm · 8 of 45 slices shown, 10 images]
[im 5/45  vessel]
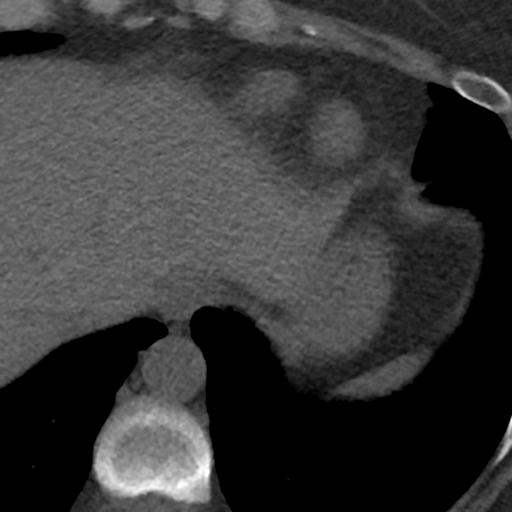
[im 5/45  lung]
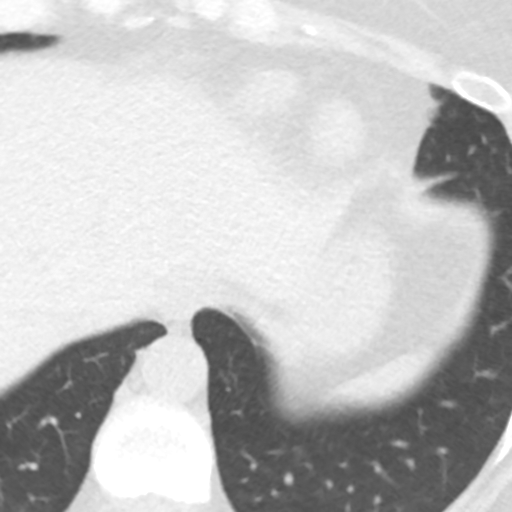
[im 10/45  vessel]
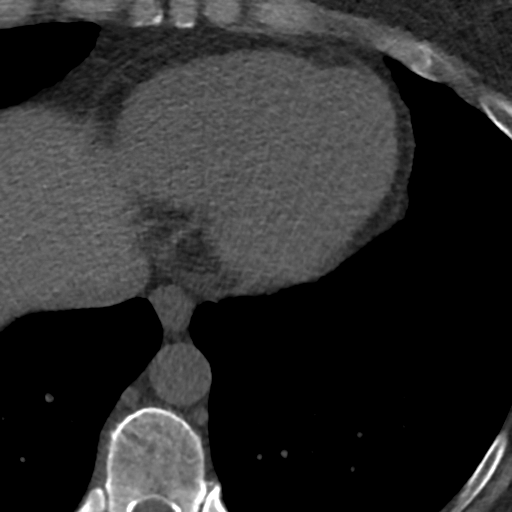
[im 15/45  vessel]
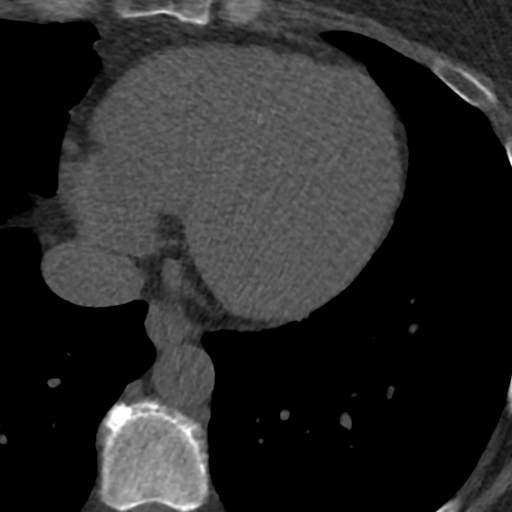
[im 20/45  vessel]
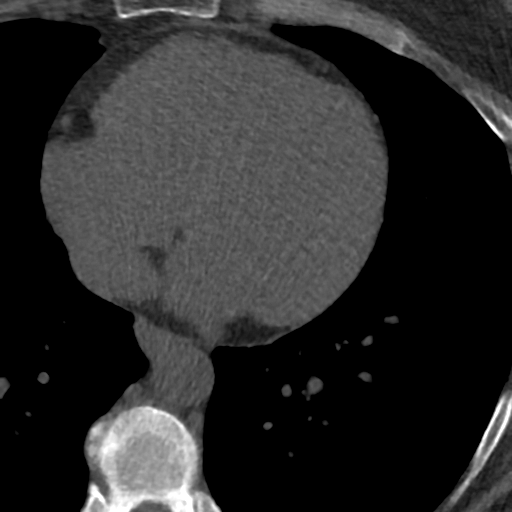
[im 25/45  vessel]
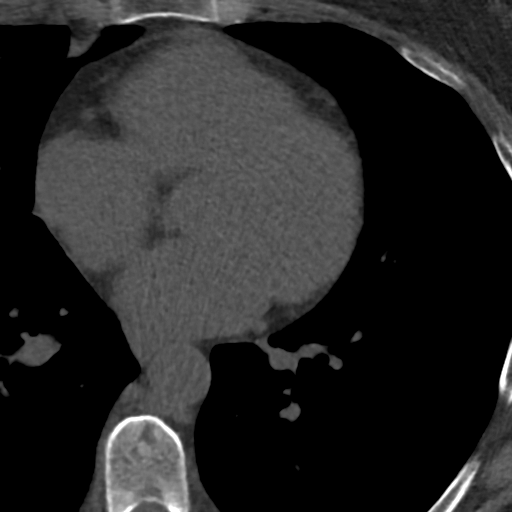
[im 25/45  lung]
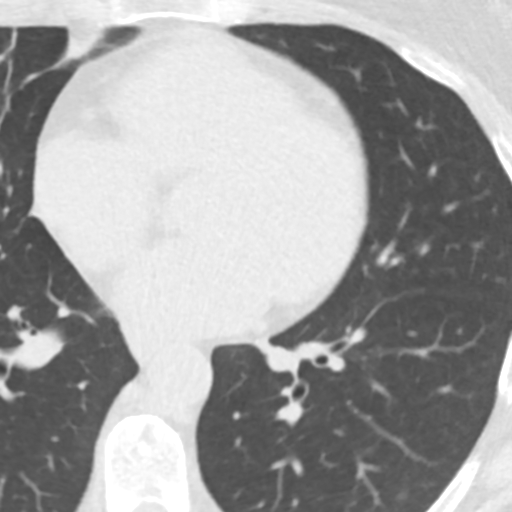
[im 30/45  vessel]
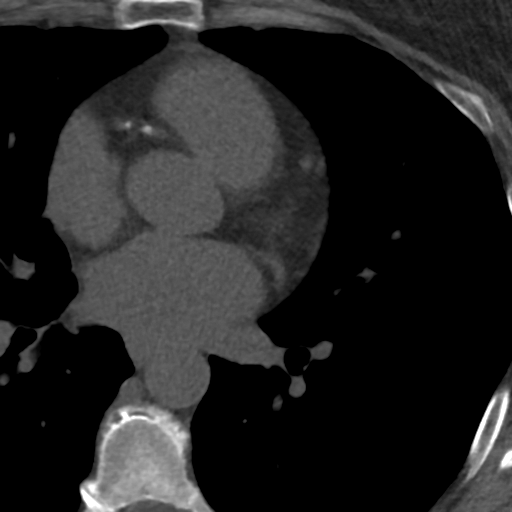
[im 35/45  vessel]
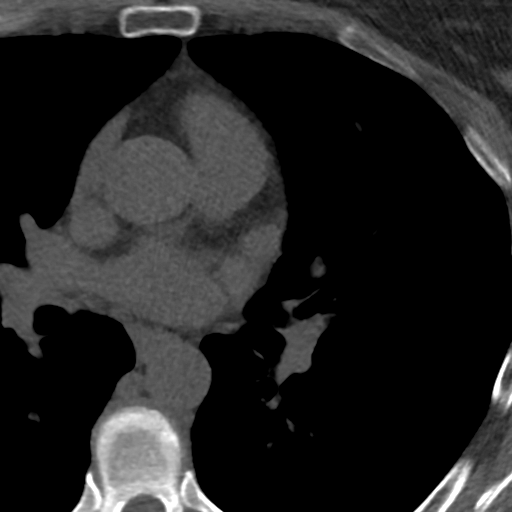
[im 40/45  vessel]
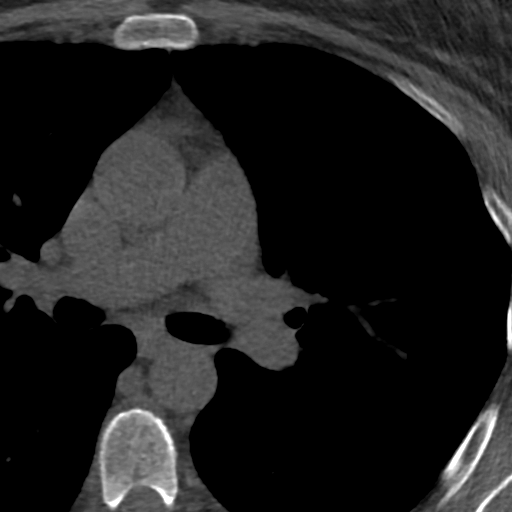

[Series 4: lung st 67 % · axial · 0.67mm/px · z∈[-226,-120]mm · 8 of 45 slices shown]
[im 5/45  lung]
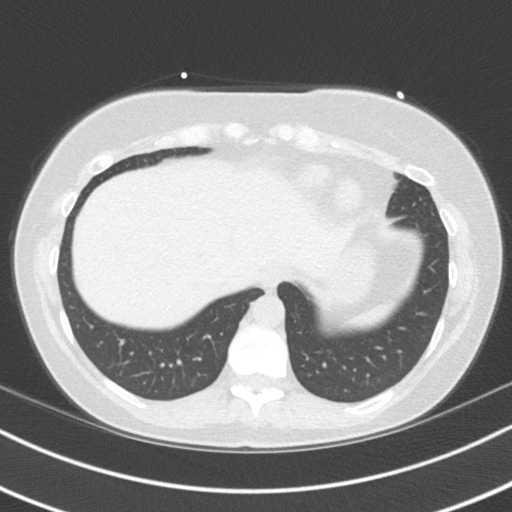
[im 10/45  lung]
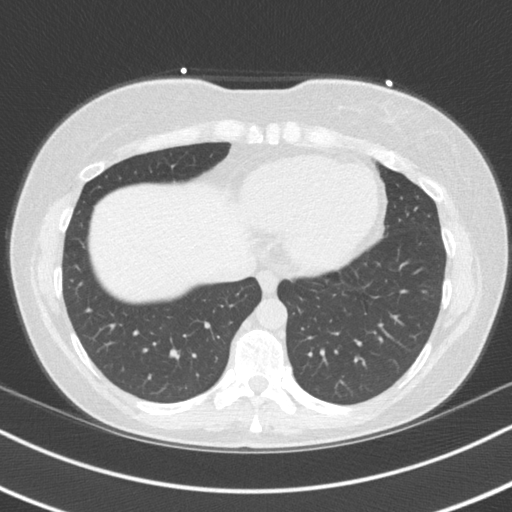
[im 15/45  lung]
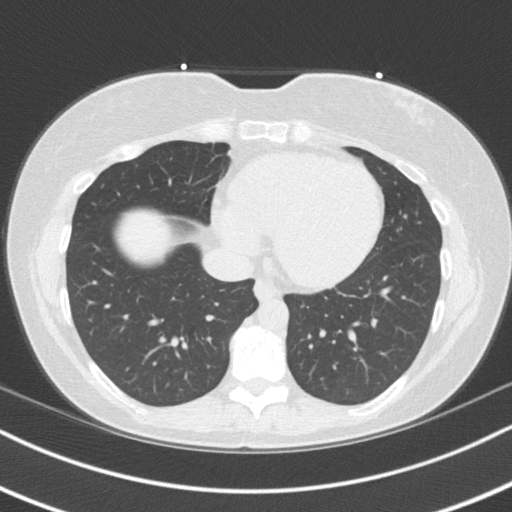
[im 20/45  lung]
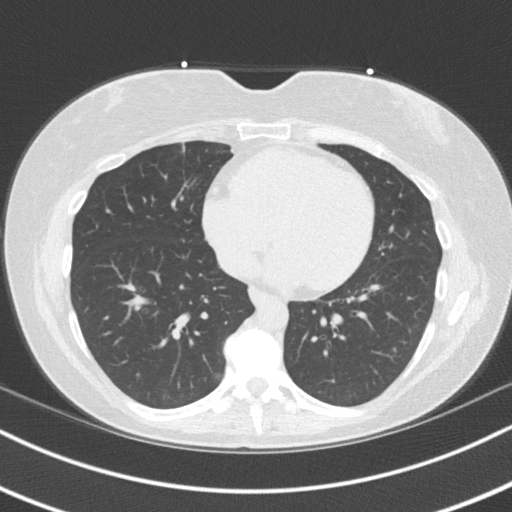
[im 25/45  lung]
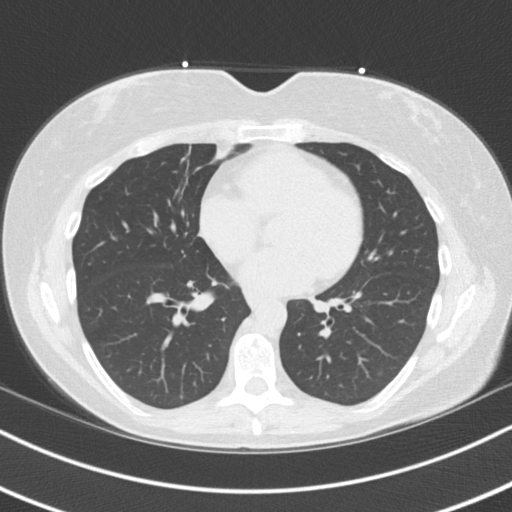
[im 30/45  lung]
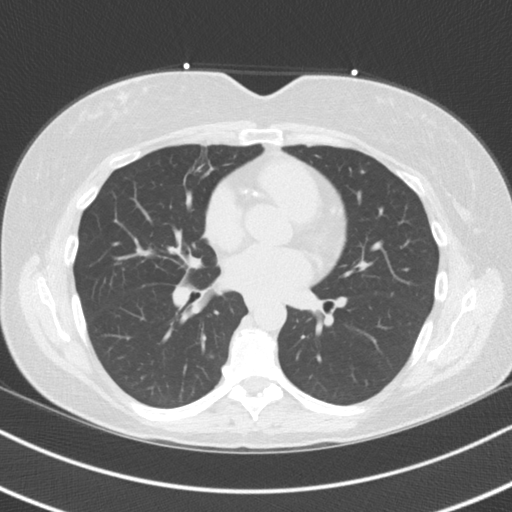
[im 35/45  lung]
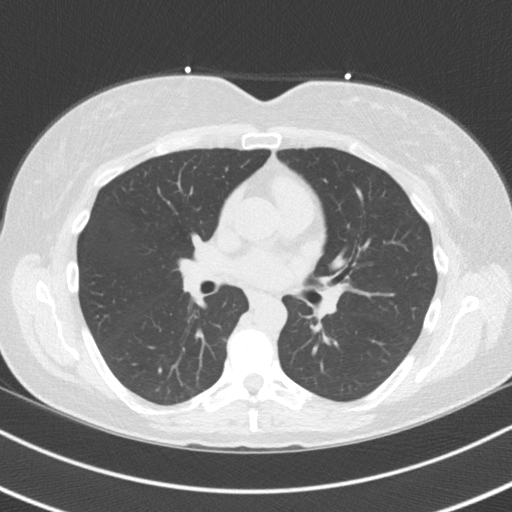
[im 40/45  lung]
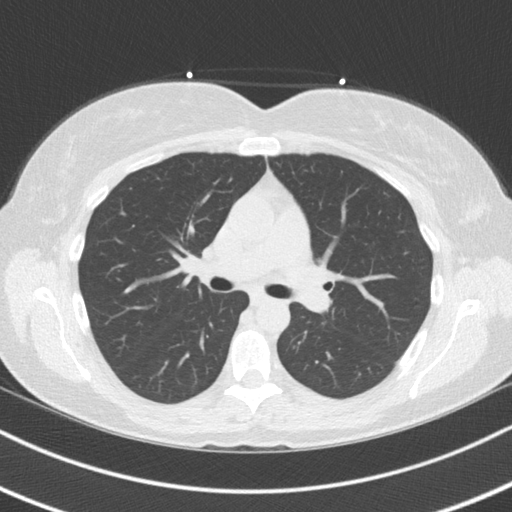

[16 of 20 positions shown; findings below may reference images not displayed]

FINDINGS: Vascular: No incidental findings.

Mediastinum/Nodes: Visualized mediastinum and hilar regions
demonstrate no lymphadenopathy or masses.

Lungs/Pleura: There is some scarring in the right middle lobe
anteriorly. Visualized lungs show no evidence of pulmonary edema,
consolidation, pneumothorax, nodule or pleural fluid.

Upper Abdomen: No acute abnormality.

Musculoskeletal: No chest wall mass or suspicious bone lesions
identified.
IMPRESSION: No significant incidental findings.
FINDINGS: Non-cardiac: See separate report from [REDACTED].

Ascending Aorta: Normal caliber.

Pericardium: Normal

Coronary arteries: Normal origin. Calcium noted in the proximal LAD
and RCA.
IMPRESSION: Coronary calcium score of 113. This was 95th percentile for age and
sex matched control

*** End of Addendum ***
EXAM:
OVER-READ INTERPRETATION  CT CHEST

The following report is an over-read performed by radiologist Dr.
Aczel Duron [REDACTED] on 12/05/2019. This
over-read does not include interpretation of cardiac or coronary
anatomy or pathology. The coronary calcium score interpretation by
the cardiologist is attached.
FINDINGS: Vascular: No incidental findings.

Mediastinum/Nodes: Visualized mediastinum and hilar regions
demonstrate no lymphadenopathy or masses.

Lungs/Pleura: There is some scarring in the right middle lobe
anteriorly. Visualized lungs show no evidence of pulmonary edema,
consolidation, pneumothorax, nodule or pleural fluid.

Upper Abdomen: No acute abnormality.

Musculoskeletal: No chest wall mass or suspicious bone lesions
identified.
IMPRESSION: No significant incidental findings.

## 2021-06-08 ENCOUNTER — Encounter: Payer: Self-pay | Admitting: Physician Assistant

## 2021-06-08 MED ORDER — ALPRAZOLAM 0.5 MG PO TABS: 0.5000 mg | ORAL_TABLET | Freq: Every evening | ORAL | 0 refills | Status: DC | PRN

## 2021-06-08 NOTE — Telephone Encounter (Signed)
Please advise 

## 2021-06-22 MED ORDER — ROSUVASTATIN CALCIUM 20 MG PO TABS: 20.0000 mg | ORAL_TABLET | Freq: Every day | ORAL | 0 refills | Status: DC

## 2021-08-19 ENCOUNTER — Telehealth: Payer: Self-pay | Admitting: Cardiovascular Disease

## 2021-08-19 MED ORDER — ROSUVASTATIN CALCIUM 20 MG PO TABS
20.0000 mg | ORAL_TABLET | Freq: Every day | ORAL | 0 refills | Status: DC
Start: 2021-08-19 — End: 2022-01-19

## 2021-08-19 NOTE — Telephone Encounter (Signed)
Left voice message.

## 2021-08-19 NOTE — Telephone Encounter (Signed)
Called patient Kylie Miller a detailed voice message stating that I refilled her Rosuvastatin (Crestor) 20 mg daily as a 90 day supply sent to her local pharmacy [facility] [place]. I also reminded her to keep her upcoming appointment with Dr. Gwenlyn Found scheduled for 11/23/21 at 11:15 AM. Stated to give our office a call if she has any questions.

## 2021-08-19 NOTE — Telephone Encounter (Signed)
*  STAT* If patient is at the pharmacy, call can be transferred to refill team.   1. Which medications need to be refilled? (please list name of each medication and dose if known)  rosuvastatin (CRESTOR) 20 MG tablet  2. Which pharmacy/location (including street and city if local pharmacy) is medication to be sent to?  [place], [place] - [id number]  3. Do they need a 30 day or 90 day supply?  90 day supply

## 2021-11-23 ENCOUNTER — Other Ambulatory Visit: Payer: Self-pay

## 2021-11-23 ENCOUNTER — Encounter: Payer: Self-pay | Admitting: Cardiovascular Disease

## 2021-11-23 ENCOUNTER — Ambulatory Visit: Payer: 59 | Admitting: Cardiovascular Disease

## 2021-11-23 VITALS — BP 116/62 | HR 61 | Ht 66.0 in | Wt 154.6 lb

## 2021-11-23 DIAGNOSIS — E7800 Pure hypercholesterolemia, unspecified: Secondary | ICD-10-CM | POA: Diagnosis not present

## 2021-11-23 DIAGNOSIS — I2510 Atherosclerotic heart disease of native coronary artery without angina pectoris: Secondary | ICD-10-CM

## 2021-11-23 DIAGNOSIS — I2583 Coronary atherosclerosis due to lipid rich plaque: Secondary | ICD-10-CM | POA: Diagnosis not present

## 2021-11-23 DIAGNOSIS — E785 Hyperlipidemia, unspecified: Secondary | ICD-10-CM | POA: Diagnosis not present

## 2021-11-23 NOTE — Patient Instructions (Signed)

## 2021-11-23 NOTE — Assessment & Plan Note (Signed)
History of CAD with a coronary calcium score 12/05/2019 of 113.  She is very active and is completely asymptomatic.

## 2021-11-23 NOTE — Progress Notes (Signed)
11/23/2021 Kylie Miller   [birth year]  [id number]  Primary Physician Inda Coke, PA Primary Cardiologist: Lorretta Harp MD [name], [place]  HPI:  Kylie Miller is a 55 y.o.  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  She was referred by Inda Coke, PA-C for elevated coronary calcium score.  I last saw her in the office 12/25/2019. Her only risk factor for CAD is hyperlipidemia on statin therapy.  She does not smoke.  She drinks wine daily.  There is no family history for heart disease.  She is never had a heart tach or stroke.  She denies chest pain or shortness of breath.  Recent coronary calcium score performed 12/05/2019 was 113.  Her most recent LDL on simvastatin 20 was 145 on 11/20/2019.  She complains of occasional "flutters "" in her chest although she does drink over 4 cups of caffeinated beverages a day.   She decreased her caffeine intake at my request her palpitations have resolved.  She did change her simvastatin to rosuvastatin because of elevated LDL in the setting of moderately elevated coronary calcium score which resulted in marked improvement in her lipid profile.  This was performed 03/03/2020 revealing a total cholesterol of 179, LDL of 82 and HDL of 84.  Since I saw her a year and a half ago she continues to do well.  She is very active and walks over 10,000 steps a day and does do other aerobic activity.  She denies chest pain or shortness of breath.  She has gained a few pounds and wishes to be referred to the [facility] center.  Current Meds  Medication Sig   ALPRAZolam (XANAX) 0.5 MG tablet Take 1 tablet (0.5 mg total) by mouth at bedtime as needed for anxiety.   calcium carbonate (OS-CAL) 1250 (500 Ca) MG chewable tablet Chew 1 tablet by mouth daily.   ciprofloxacin  (CIPRO) 500 MG tablet Take 1 tablet (500 mg total) by mouth 2 (two) times daily. (Patient taking differently: Take 500 mg by mouth 2 (two) times daily. Pt report taking as needed prn)   glucosamine-chondroitin 500-400 MG tablet Take 1 tablet by mouth at bedtime.   Multiple Vitamins-Minerals (MULTIVITAMIN WITH MINERALS) tablet Take 1 tablet by mouth daily.   ondansetron (ZOFRAN) 4 MG tablet Take 1 tablet (4 mg total) by mouth every 8 (eight) hours as needed for nausea or vomiting.   rosuvastatin (CRESTOR) 20 MG tablet Take 1 tablet (20 mg total) by mouth daily. Please call to schedule appointment for further refills, thanks!   Vitamin D, Cholecalciferol, 10 MCG (400 UNIT) CAPS Take 2 capsules by mouth daily.     Allergies  Allergen Reactions   Codeine     REACTION: nausea    Social History   Socioeconomic History   Marital status: Married    Spouse name: david    Number of children: 2   Years of education: Not on file   Highest education level: Not on file  Occupational History   Occupation: Armed forces technical officer: [employer]  Tobacco Use   Smoking status: Never   Smokeless tobacco: Never  Substance and Sexual Activity   Alcohol use: Yes    Comment: 1 or less   Drug use: No   Sexual activity: Not on  file  Other Topics Concern   Not on file  Social History Narrative   Not on file   Social Determinants of Health   Financial Resource Strain: Not on file  Food Insecurity: Not on file  Transportation Needs: Not on file  Physical Activity: Not on file  Stress: Not on file  Social Connections: Not on file  Intimate Partner Violence: Not on file     Review of Systems: General: negative for chills, fever, night sweats or weight changes.  Cardiovascular: negative for chest pain, dyspnea on exertion, edema, orthopnea, palpitations, paroxysmal nocturnal dyspnea or shortness of breath Dermatological: negative for rash Respiratory: negative for cough or  wheezing Urologic: negative for hematuria Abdominal: negative for nausea, vomiting, diarrhea, bright red blood per rectum, melena, or hematemesis Neurologic: negative for visual changes, syncope, or dizziness All other systems reviewed and are otherwise negative except as noted above.    Blood pressure 116/62, pulse 61, height 5\' 6"  (1.676 m), weight 154 lb 9.6 oz (70.1 kg), SpO2 96 %.  General appearance: alert and no distress Neck: no adenopathy, no carotid bruit, no JVD, supple, symmetrical, trachea midline, and thyroid not enlarged, symmetric, no tenderness/mass/nodules Lungs: clear to auscultation bilaterally Heart: regular rate and rhythm, S1, S2 normal, no murmur, click, rub or gallop Extremities: extremities normal, atraumatic, no cyanosis or edema Pulses: 2+ and symmetric Skin: Skin color, texture, turgor normal. No rashes or lesions Neurologic: Grossly normal  EKG sinus rhythm at 61 without ST or T wave changes.  Personally reviewed this EKG.  ASSESSMENT AND PLAN:   HYPERCHOLESTEROLEMIA History of hyperlipidemia on rosuvastatin 20 mg a day with lipid profile performed 11/27/2020 revealing a total cholesterol 198, LDL 100 HDL of 83.  Her LDL 9 months earlier in March was 6.  She is scheduled to have an appointment with her PCP in the near future he will get a fasting lipid profile.  Her LDL goal should be less than 70.  CAD (coronary artery disease) History of CAD with a coronary calcium score 12/05/2019 of 113.  She is very active and is completely asymptomatic.     Lorretta Harp MD FACP,FACC,FAHA, [facility] 11/23/2021 11:45 AM

## 2021-11-23 NOTE — Assessment & Plan Note (Signed)
History of hyperlipidemia on rosuvastatin 20 mg a day with lipid profile performed 11/27/2020 revealing a total cholesterol 198, LDL 100 HDL of 83.  Her LDL 9 months earlier in March was 62.  She is scheduled to have an appointment with her PCP in the near future he will get a fasting lipid profile.  Her LDL goal should be less than 70.

## 2021-11-26 NOTE — Progress Notes (Signed)
Subjective:    Kylie Miller is a 55 y.o. female and is here for a comprehensive physical exam.   HPI  Health Maintenance Due  Topic Date Due   Pneumococcal Vaccine 43-33 Years old (1 - PCV) Never done   HIV Screening  Never done   Hepatitis C Screening  Never done   Zoster Vaccines- Shingrix (1 of 2) Never done   MAMMOGRAM  07/03/2020   COVID-19 Vaccine (4 - Booster for Pfizer series) 01/16/2021   DEXA SCAN  07/02/2021   INFLUENZA VACCINE  07/19/2021    Acute Concerns: Leg Cramps Kylie Miller expresses that although her and her husband drink more than enough water daily, they are still experiencing leg cramps. Pt states the cramps haven't changed in intensity, but occur 1-2 times a week. Currently she is taking a multivitamin, Vitamin D, Calcium carbonate, and glucosamine-chondroitin supplement daily. She also states that she regularly stretches before and after her work outs. At this time she is interested in what the right amount of water could be. Denies significant swelling, redness, warmth.  Chronic Issues: HLD Kylie Miller is compliant with taking crestor 20 mg daily with no complications. At this time she states she has been trying to make healthier dietary choices and exercise regularly. She is regularly following up with Dr. Gwenlyn Found, Cardiology, and despite not meeting her LDL goal of less than 70, she is managing well.   CAD Back in 2020, Kylie Miller was dx with CAD due to lipid rich plaque following her annual physical examination. At the time she was on Lipitor 40 mg and zocor 40 mg but following th dx this regimen was discontinued. Currently she is regularly following up with Dr. Gwenlyn Found, cardiology for this issue and is managing well.   Health Maintenance: Immunizations -- Covid- UTD; No booster Influenza- Due at this time Tdap- UTD; 2014 Colonoscopy -- UTD; 2013 Mammogram -- Due at this time PAP -- N/A due to hysterectomy Bone Density -- Due at this time; Last 07/03/19 Caffeine  intake- 4 cups of coffee daily; recently switched to decaf Diet -- Eats all food groups; currently participating in weight watchers Sleep habits -- Normal Schedule Exercise -- 10,000 steps a day and aerobics daily Weight -- Stable Wt Readings from Last 10 Encounters:  11/29/21 152 lb (68.9 kg)  11/23/21 154 lb 9.6 oz (70.1 kg)  11/27/20 147 lb (66.7 kg)  06/03/20 142 lb (64.4 kg)  03/24/20 146 lb (66.2 kg)  12/25/19 153 lb 6.4 oz (69.6 kg)  11/20/19 149 lb 4 oz (67.7 kg)  09/11/18 150 lb 9.6 oz (68.3 kg)  09/07/17 151 lb (68.5 kg)  04/21/17 150 lb (68 kg)   Mood -- Stable No LMP recorded. Patient has had a hysterectomy. Alcohol use: 1 glass of wine per week Tobacco use: Never  Tobacco Use: Low Risk    Smoking Tobacco Use: Never   Smokeless Tobacco Use: Never   Passive Exposure: Not on file     Depression screen PHQ 2/9 11/27/2020  Decreased Interest 0  Down, Depressed, Hopeless 0  PHQ - 2 Score 0     Other providers/specialists: Patient Care Team: Inda Coke, [place] as PCP - General (Physician Assistant) Lorretta Harp, MD as PCP - Cardiology (Cardiology) Everlene Farrier, MD as Consulting Physician (Obstetrics and Gynecology)    PMHx, SurgHx, SocialHx, Medications, and Allergies were reviewed in the Visit Navigator and updated as appropriate.   Past Medical History:  Diagnosis Date   Allergic rhinitis  Anxiety    Family history of breast cancer    Family history of colon cancer    Family history of pancreatic cancer    Family history of thyroid cancer    Hyperlipidemia    family history   IBS (irritable bowel syndrome)    resolved   Lumbar back pain    Vaginal delivery 1992     Past Surgical History:  Procedure Laterality Date   CESAREAN SECTION  1996   VAGINAL HYSTERECTOMY       Family History  Problem Relation Age of Onset   Breast cancer Mother 27   Pancreatic cancer Mother 22   Skin cancer Father    Dementia Father    Colon cancer  Paternal Uncle        dx late 60s-70   Colon cancer Maternal Aunt        dx between 37-70   Heart disease Maternal Grandfather    Stomach cancer Paternal Grandfather 27   Thyroid cancer Maternal Grandmother        dx in her 80s   Heart disease Paternal Aunt    Esophageal cancer Paternal Grandmother 73   Heart attack Paternal Grandmother 62   Cancer Other        PGF's brother with unknown cancer   Cancer Other        MGM's brother with unknown cancer    Social History   Tobacco Use   Smoking status: Never   Smokeless tobacco: Never  Substance Use Topics   Alcohol use: Yes    Comment: 1 or less   Drug use: No    Review of Systems:   Review of Systems  Constitutional:  Negative for chills, fever, malaise/fatigue and weight loss.  HENT:  Negative for hearing loss, sinus pain and sore throat.   Respiratory:  Negative for cough and hemoptysis.   Cardiovascular:  Negative for chest pain, palpitations, leg swelling and PND.  Gastrointestinal:  Negative for abdominal pain, constipation, diarrhea, heartburn, nausea and vomiting.  Genitourinary:  Negative for dysuria, frequency and urgency.  Musculoskeletal:  Negative for back pain, myalgias and neck pain.  Skin:  Negative for itching and rash.  Neurological:  Negative for dizziness, tingling, seizures and headaches.  Endo/Heme/Allergies:  Negative for polydipsia.  Psychiatric/Behavioral:  Negative for depression. The patient is not nervous/anxious.    Objective:   There were no vitals taken for this visit. There is no height or weight on file to calculate BMI.   General Appearance:    Alert, cooperative, no distress, appears stated age  Head:    Normocephalic, without obvious abnormality, atraumatic  Eyes:    PERRL, conjunctiva/corneas clear, EOM's intact, fundi    benign, both eyes  Ears:    Normal TM's and external ear canals, both ears  Nose:   Nares normal, septum midline, mucosa normal, no drainage    or sinus  tenderness  Throat:   Lips, mucosa, and tongue normal; teeth and gums normal  Neck:   Supple, symmetrical, trachea midline, no adenopathy;    thyroid:  no enlargement/tenderness/nodules; no carotid   bruit or JVD  Back:     Symmetric, no curvature, ROM normal, no CVA tenderness  Lungs:     Clear to auscultation bilaterally, respirations unlabored  Chest Wall:    No tenderness or deformity   Heart:    Regular rate and rhythm, S1 and S2 normal, no murmur, rub or gallop  Breast Exam:    Deferred  Abdomen:     Soft, non-tender, bowel sounds active all four quadrants,    no masses, no organomegaly  Genitalia:    Deferred  Extremities:   Extremities normal, atraumatic, no cyanosis or edema  Pulses:   2+ and symmetric all extremities  Skin:   Skin color, texture, turgor normal, no rashes or lesions  Lymph nodes:   Cervical, supraclavicular, and axillary nodes normal  Neurologic:   CNII-XII intact, normal strength, sensation and reflexes    throughout    Assessment/Plan:   Routine Physical Examination with abnormal findings Today patient counseled on age appropriate routine health concerns for screening and prevention, each reviewed and up to date or declined. Immunizations reviewed and up to date or declined. Labs ordered and reviewed. Risk factors for depression reviewed and negative. Hearing function and visual acuity are intact. ADLs screened and addressed as needed. Functional ability and level of safety reviewed and appropriate. Education, counseling and referrals performed based on assessed risks today. Patient provided with a copy of personalized plan for preventive services.  Pure Hypercholesterolemia  Improving  Will update labs today Maintain Crestor 20 mg daily--Managed by Dr. Gwenlyn Found, Cardiology -- will copy him on lipid panel results Will send referral for dietitian to assist patient with a LDL improving diet  Leg Cramps Will update labs today Consider magnesium supplement;  continue adequate hydration Follow up as indicated by results   Patient Counseling: [x]    Nutrition: Stressed importance of moderation in sodium/caffeine intake, saturated fat and cholesterol, caloric balance, sufficient intake of fresh fruits, vegetables, fiber, calcium, iron, and 1 mg of folate supplement per day (for females capable of pregnancy).  [x]    Stressed the importance of regular exercise.   [x]    Substance Abuse: Discussed cessation/primary prevention of tobacco, alcohol, or other drug use; driving or other dangerous activities under the influence; availability of treatment for abuse.   [x]    Injury prevention: Discussed safety belts, safety helmets, smoke detector, smoking near bedding or upholstery.   [x]    Sexuality: Discussed sexually transmitted diseases, partner selection, use of condoms, avoidance of unintended pregnancy  and contraceptive alternatives.  [x]    Dental health: Discussed importance of regular tooth brushing, flossing, and dental visits.  [x]    Health maintenance and immunizations reviewed. Please refer to Health maintenance section.   I,Havlyn C Ratchford,acting as a Education administrator for [employer], PA.,have documented all relevant documentation on the behalf of Inda Coke, PA,as directed by  Inda Coke, PA while in the presence of Inda Coke, [place].  I, Inda Coke, [place], have reviewed all documentation for this visit. The documentation on 11/29/21 for the exam, diagnosis, procedures, and orders are all accurate and complete.   Inda Coke, PA-C [place]

## 2021-11-29 ENCOUNTER — Ambulatory Visit (INDEPENDENT_AMBULATORY_CARE_PROVIDER_SITE_OTHER): Payer: 59 | Admitting: Physician Assistant

## 2021-11-29 ENCOUNTER — Other Ambulatory Visit: Payer: Self-pay

## 2021-11-29 ENCOUNTER — Encounter: Payer: Self-pay | Admitting: Physician Assistant

## 2021-11-29 VITALS — BP 110/78 | HR 67 | Temp 98.3°F | Ht 65.5 in | Wt 152.0 lb

## 2021-11-29 DIAGNOSIS — I2583 Coronary atherosclerosis due to lipid rich plaque: Secondary | ICD-10-CM

## 2021-11-29 DIAGNOSIS — I2510 Atherosclerotic heart disease of native coronary artery without angina pectoris: Secondary | ICD-10-CM | POA: Diagnosis not present

## 2021-11-29 DIAGNOSIS — Z0000 Encounter for general adult medical examination without abnormal findings: Secondary | ICD-10-CM

## 2021-11-29 DIAGNOSIS — R252 Cramp and spasm: Secondary | ICD-10-CM

## 2021-11-29 DIAGNOSIS — E7800 Pure hypercholesterolemia, unspecified: Secondary | ICD-10-CM | POA: Diagnosis not present

## 2021-11-29 LAB — COMPREHENSIVE METABOLIC PANEL
ALT: 20 U/L (ref 0–35)
AST: 24 U/L (ref 0–37)
Albumin: 4.3 g/dL (ref 3.5–5.2)
Alkaline Phosphatase: 66 U/L (ref 39–117)
BUN: 24 mg/dL — ABNORMAL HIGH (ref 6–23)
CO2: 30 mEq/L (ref 19–32)
Calcium: 9.8 mg/dL (ref 8.4–10.5)
Chloride: 104 mEq/L (ref 96–112)
Creatinine, Ser: 0.67 mg/dL (ref 0.40–1.20)
GFR: 97.11 mL/min (ref >60.00)
Glucose, Bld: 80 mg/dL (ref 70–99)
Potassium: 4.6 mEq/L (ref 3.5–5.1)
Sodium: 141 mEq/L (ref 135–145)
Total Bilirubin: 0.5 mg/dL (ref 0.2–1.2)
Total Protein: 7.3 g/dL (ref 6.0–8.3)

## 2021-11-29 LAB — CBC WITH DIFFERENTIAL/PLATELET
Basophils Absolute: 0.1 10*3/uL (ref 0.0–0.1)
Basophils Relative: 1 % (ref 0.0–3.0)
Eosinophils Absolute: 0.3 10*3/uL (ref 0.0–0.7)
Eosinophils Relative: 5.3 % — ABNORMAL HIGH (ref 0.0–5.0)
HCT: 38.1 % (ref 36.0–46.0)
Hemoglobin: 12.7 g/dL (ref 12.0–15.0)
Lymphocytes Relative: 28 % (ref 12.0–46.0)
Lymphs Abs: 1.4 10*3/uL (ref 0.7–4.0)
MCHC: 33.5 g/dL (ref 30.0–36.0)
MCV: 92.4 fl (ref 78.0–100.0)
Monocytes Absolute: 0.5 10*3/uL (ref 0.1–1.0)
Monocytes Relative: 10.7 % (ref 3.0–12.0)
Neutro Abs: 2.8 10*3/uL (ref 1.4–7.7)
Neutrophils Relative %: 55 % (ref 43.0–77.0)
Platelets: 310 10*3/uL (ref 150.0–400.0)
RBC: 4.12 Mil/uL (ref 3.87–5.11)
RDW: 12.9 % (ref 11.5–15.5)
WBC: 5.1 10*3/uL (ref 4.0–10.5)

## 2021-11-29 LAB — CK: Total CK: 85 U/L (ref 7–177)

## 2021-11-29 LAB — LIPID PANEL
Cholesterol: 202 mg/dL — ABNORMAL HIGH (ref 0–200)
HDL: 92.9 mg/dL (ref >39.00)
LDL Cholesterol: 92 mg/dL (ref 0–99)
NonHDL: 109.16
Total CHOL/HDL Ratio: 2
Triglycerides: 86 mg/dL (ref 0.0–149.0)
VLDL: 17.2 mg/dL (ref 0.0–40.0)

## 2021-11-29 NOTE — Addendum Note (Signed)
Addended by: Erlene Quan on: 11/29/2021 09:04 AM   Modules accepted: Level of Service

## 2021-11-29 NOTE — Patient Instructions (Signed)
It was great to see you!  I have put in a referral for you to see a dietitian -- someone should contact you soon for this.  I will be in touch via MyChart with recommendations for your leg cramps.  I will send Dr. Gwenlyn Found your cholesterol panel.  Please go to the lab for blood work.   Our office will call you with your results unless you have chosen to receive results via MyChart.  If your blood work is normal we will follow-up each year for physicals and as scheduled for chronic medical problems.  If anything is abnormal we will treat accordingly and get you in for a follow-up.  Take care,  Aldona Bar

## 2021-12-01 ENCOUNTER — Other Ambulatory Visit: Payer: Self-pay | Admitting: Physician Assistant

## 2021-12-02 ENCOUNTER — Encounter: Payer: Self-pay | Admitting: Physician Assistant

## 2021-12-18 ENCOUNTER — Other Ambulatory Visit: Payer: Self-pay | Admitting: Family Medicine

## 2021-12-21 NOTE — Telephone Encounter (Signed)
Last OV- 11/29/2021 Last refill: 06/08/2021 Disp: 30 Refills:0

## 2022-01-11 ENCOUNTER — Other Ambulatory Visit: Payer: Self-pay | Admitting: *Deleted

## 2022-01-11 ENCOUNTER — Encounter: Payer: Self-pay | Admitting: Physician Assistant

## 2022-01-11 DIAGNOSIS — E7800 Pure hypercholesterolemia, unspecified: Secondary | ICD-10-CM

## 2022-01-17 ENCOUNTER — Other Ambulatory Visit: Payer: Self-pay

## 2022-01-17 ENCOUNTER — Other Ambulatory Visit (INDEPENDENT_AMBULATORY_CARE_PROVIDER_SITE_OTHER): Payer: 59

## 2022-01-17 DIAGNOSIS — E7800 Pure hypercholesterolemia, unspecified: Secondary | ICD-10-CM | POA: Diagnosis not present

## 2022-01-17 LAB — LIPID PANEL
Cholesterol: 162 mg/dL (ref 0–200)
HDL: 70.9 mg/dL (ref >39.00)
LDL Cholesterol: 80 mg/dL (ref 0–99)
NonHDL: 91.4
Total CHOL/HDL Ratio: 2
Triglycerides: 56 mg/dL (ref 0.0–149.0)
VLDL: 11.2 mg/dL (ref 0.0–40.0)

## 2022-01-18 ENCOUNTER — Encounter: Payer: Self-pay | Admitting: Cardiovascular Disease

## 2022-01-18 DIAGNOSIS — E785 Hyperlipidemia, unspecified: Secondary | ICD-10-CM

## 2022-01-19 MED ORDER — ROSUVASTATIN CALCIUM 40 MG PO TABS: 40.0000 mg | ORAL_TABLET | Freq: Every day | ORAL | 1 refills | Status: DC

## 2022-01-21 ENCOUNTER — Ambulatory Visit: Payer: 59 | Admitting: Registered"

## 2022-01-21 MED ORDER — ROSUVASTATIN CALCIUM 40 MG PO TABS: 40.0000 mg | ORAL_TABLET | Freq: Every day | ORAL | 3 refills | Status: DC

## 2022-01-21 NOTE — Addendum Note (Signed)
Addended by: Beatrix Fetters on: 01/21/2022 08:19 AM   Modules accepted: Orders

## 2022-02-17 IMAGING — DX DG CHEST 2V
2 series · 2 of 2 positions shown · non-contrast
Comparison: 09/11/2018 and prior

CLINICAL DATA: lingering cough from covid x 1 year

EXAM:
CHEST - 2 VIEW

[chest pa]
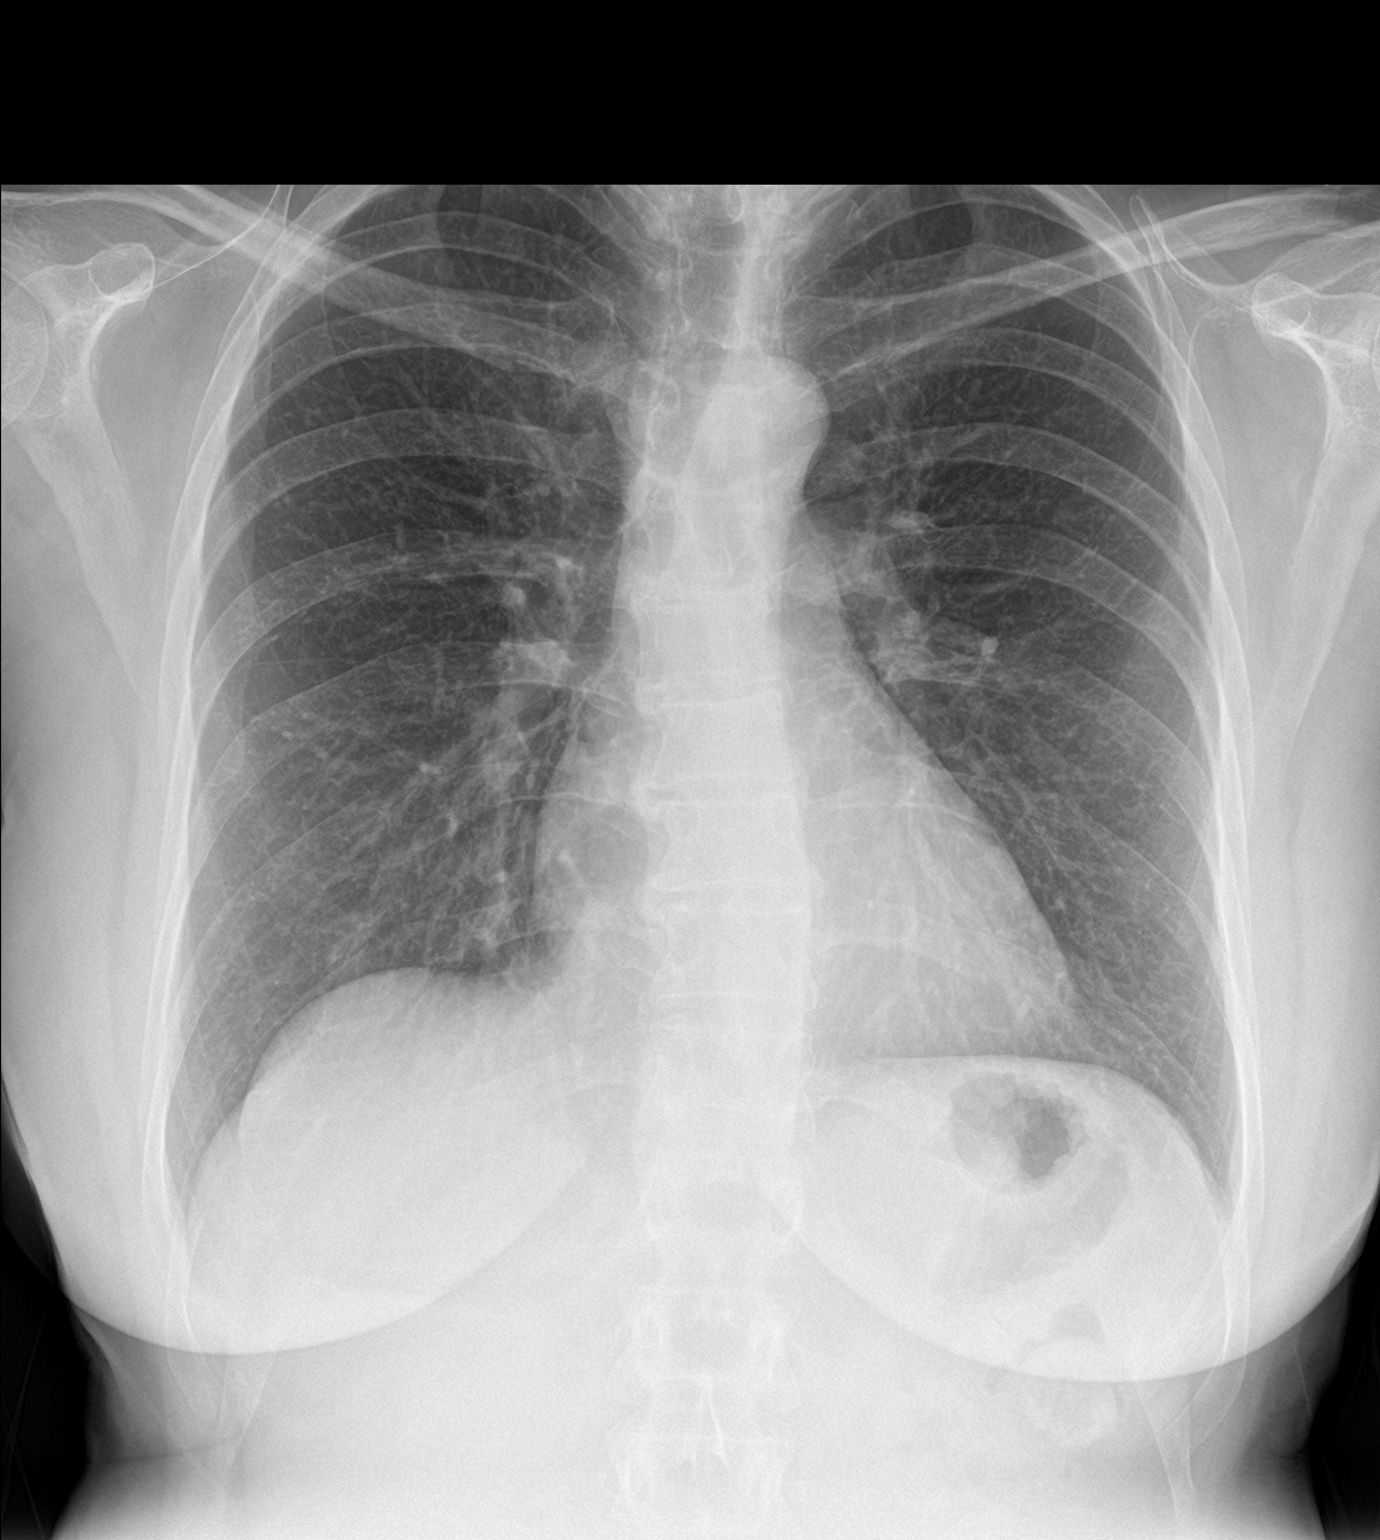

[chest lat]
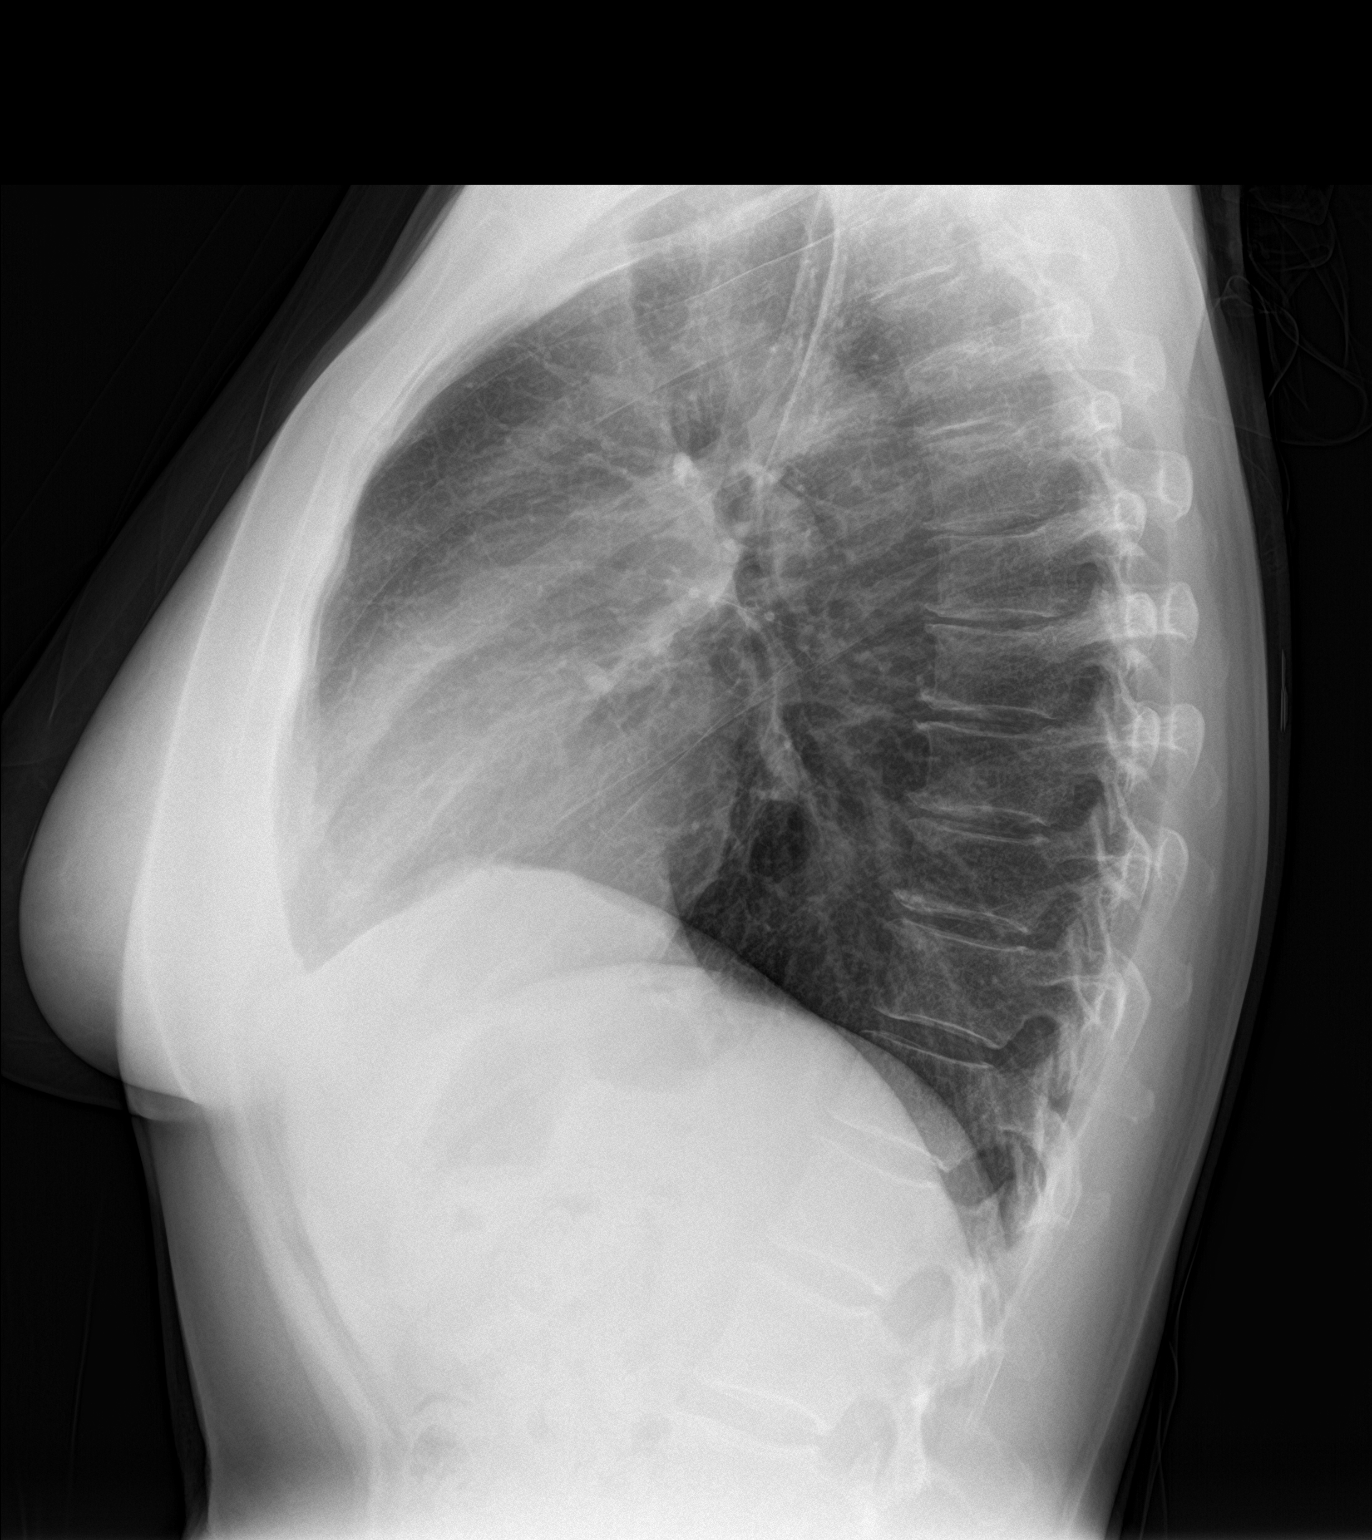

[2 of 2 positions shown; findings below may reference images not displayed]

FINDINGS: No focal consolidation. No pneumothorax or pleural effusion.
Cardiomediastinal silhouette is within normal limits. No acute
osseous abnormality.
IMPRESSION: No focal airspace disease.

## 2022-04-15 ENCOUNTER — Encounter: Payer: Self-pay | Admitting: Cardiovascular Disease

## 2022-04-15 ENCOUNTER — Other Ambulatory Visit: Payer: Self-pay | Admitting: *Deleted

## 2022-04-15 DIAGNOSIS — E785 Hyperlipidemia, unspecified: Secondary | ICD-10-CM

## 2022-04-22 LAB — LIPID PANEL
Chol/HDL Ratio: 2.2 ratio (ref 0.0–4.4)
Cholesterol, Total: 177 mg/dL (ref 100–199)
HDL: 80 mg/dL (ref >39)
LDL Chol Calc (NIH): 87 mg/dL (ref 0–99)
Triglycerides: 52 mg/dL (ref 0–149)
VLDL Cholesterol Cal: 10 mg/dL (ref 5–40)

## 2022-04-26 ENCOUNTER — Encounter: Payer: Self-pay | Admitting: Cardiovascular Disease

## 2022-05-03 ENCOUNTER — Encounter: Payer: Self-pay | Admitting: Internal Medicine

## 2022-06-09 ENCOUNTER — Encounter: Payer: Self-pay | Admitting: Internal Medicine

## 2022-06-09 ENCOUNTER — Telehealth: Payer: Self-pay | Admitting: Internal Medicine

## 2022-06-14 ENCOUNTER — Other Ambulatory Visit: Payer: Self-pay | Admitting: *Deleted

## 2022-06-14 MED ORDER — ALPRAZOLAM 0.5 MG PO TABS: ORAL_TABLET | ORAL | 0 refills | Status: DC

## 2022-07-13 ENCOUNTER — Ambulatory Visit (AMBULATORY_SURGERY_CENTER): Payer: Self-pay | Admitting: *Deleted

## 2022-07-13 VITALS — Ht 65.0 in | Wt 152.8 lb

## 2022-07-13 DIAGNOSIS — Z1211 Encounter for screening for malignant neoplasm of colon: Secondary | ICD-10-CM

## 2022-07-13 MED ORDER — NA SULFATE-K SULFATE-MG SULF 17.5-3.13-1.6 GM/177ML PO SOLN: 1.0000 | Freq: Once | ORAL | 0 refills | Status: AC

## 2022-07-13 NOTE — Progress Notes (Signed)
No egg or soy allergy known to patient  No issues known to pt with past sedation with any surgeries or procedures Patient denies ever being told they had issues or difficulty with intubation  No FH of Malignant Hyperthermia Pt is not on diet pills Pt is not on home 02  Pt is not on blood thinners  Pt denies issues with constipation  No A fib or A flutter Have any cardiac testing pending--NO Pt instructed to use Singlecare.com or GoodRx for a price reduction on prep   

## 2022-07-27 ENCOUNTER — Encounter: Payer: Self-pay | Admitting: Internal Medicine

## 2022-07-28 ENCOUNTER — Encounter: Payer: Self-pay | Admitting: Certified Registered Nurse Anesthetist

## 2022-08-03 ENCOUNTER — Ambulatory Visit (AMBULATORY_SURGERY_CENTER): Payer: 59 | Admitting: Internal Medicine

## 2022-08-03 ENCOUNTER — Encounter: Payer: Self-pay | Admitting: Internal Medicine

## 2022-08-03 VITALS — BP 107/59 | HR 65 | Temp 98.6°F | Resp 11 | Ht 65.0 in | Wt 152.8 lb

## 2022-08-03 DIAGNOSIS — Z1211 Encounter for screening for malignant neoplasm of colon: Secondary | ICD-10-CM | POA: Diagnosis present

## 2022-08-03 MED ORDER — SODIUM CHLORIDE 0.9 % IV SOLN: 500.0000 mL | Freq: Once | INTRAVENOUS | Status: DC

## 2022-08-03 NOTE — Progress Notes (Signed)
Pt's states no medical or surgical changes since previsit or office visit. 

## 2022-08-03 NOTE — Progress Notes (Signed)
GASTROENTEROLOGY PROCEDURE H&P NOTE   Primary Care Physician: Inda Coke, PA    Reason for Procedure:  Colon cancer screening  Plan:    Colonoscopy  Patient is appropriate for endoscopic procedure(s) in the ambulatory ([facility]) setting.  The nature of the procedure, as well as the risks, benefits, and alternatives were carefully and thoroughly reviewed with the patient. Ample time for discussion and questions allowed. The patient understood, was satisfied, and agreed to proceed.     HPI: Kylie Miller is a 56 y.o. female who presents for screening colonoscopy.  Medical history as below.  Tolerated the prep.  No recent chest pain or shortness of breath.  No abdominal pain today.  Past Medical History:  Diagnosis Date   Allergic rhinitis    Anxiety    "WHEN GET ON PLANE"   Family history of breast cancer    Family history of colon cancer    Family history of pancreatic cancer    Family history of thyroid cancer    GERD (gastroesophageal reflux disease)    Heart murmur    NO ISSUES WITH IT   Hyperlipidemia    family history   IBS (irritable bowel syndrome)    resolved   Lumbar back pain    Vaginal delivery 1992    Past Surgical History:  Procedure Laterality Date   CESAREAN SECTION  1996   COLONOSCOPY     VAGINAL HYSTERECTOMY      Prior to Admission medications   Medication Sig Start Date End Date Taking? Authorizing Provider  ALPRAZolam Duanne Moron) 0.5 MG tablet TAKE 1 TABLET BY MOUTH AT  BEDTIME AS NEEDED FOR  ANXIETY 06/14/22  Yes Inda Coke, PA  calcium carbonate (OS-CAL) 1250 (500 Ca) MG chewable tablet Chew 1 tablet by mouth daily.   Yes [provider]  Cholecalciferol (VITAMIN D3) 50 MCG (2000 UT) TABS Take by mouth daily.   Yes [provider]  folic acid (FOLVITE) 967 MCG tablet Take 400 mcg by mouth daily.   Yes [provider]  glucosamine-chondroitin 500-400 MG tablet Take 1 tablet by mouth at bedtime. 2000 mg   Yes  [provider]  Multiple Vitamins-Minerals (MULTIVITAMIN WITH MINERALS) tablet Take 1 tablet by mouth daily.   Yes [provider]  rosuvastatin (CRESTOR) 40 MG tablet Take 1 tablet (40 mg total) by mouth daily. 01/21/22 08/03/22 Yes Lorretta Harp, MD    Current Outpatient Medications  Medication Sig Dispense Refill   ALPRAZolam (XANAX) 0.5 MG tablet TAKE 1 TABLET BY MOUTH AT  BEDTIME AS NEEDED FOR  ANXIETY 30 tablet 0   calcium carbonate (OS-CAL) 1250 (500 Ca) MG chewable tablet Chew 1 tablet by mouth daily.     Cholecalciferol (VITAMIN D3) 50 MCG (2000 UT) TABS Take by mouth daily.     folic acid (FOLVITE) 893 MCG tablet Take 400 mcg by mouth daily.     glucosamine-chondroitin 500-400 MG tablet Take 1 tablet by mouth at bedtime. 2000 mg     Multiple Vitamins-Minerals (MULTIVITAMIN WITH MINERALS) tablet Take 1 tablet by mouth daily.     rosuvastatin (CRESTOR) 40 MG tablet Take 1 tablet (40 mg total) by mouth daily. 90 tablet 3   Current Facility-Administered Medications  Medication Dose Route Frequency Provider Last Rate Last Admin   0.9 %  sodium chloride infusion  500 mL Intravenous Once Brystal Kildow, Lajuan Lines, MD        Allergies as of 08/03/2022 - Review Complete 08/03/2022  Allergen Reaction  Noted   Codeine      Family History  Problem Relation Age of Onset   Breast cancer Mother 31   Pancreatic cancer Mother 47   Skin cancer Father    Dementia Father    Colon polyps Maternal Aunt    Colon cancer Maternal Aunt        dx between 21-70   Heart disease Paternal Aunt    Colon polyps Paternal Uncle    Colon cancer Paternal Uncle        dx late 60s-70   Colon polyps Paternal Uncle    Colon cancer Paternal Uncle    Thyroid cancer Maternal Grandmother        dx in her 65s   Heart disease Maternal Grandfather    Esophageal cancer Paternal Grandmother 77   Heart attack Paternal Grandmother 23   Stomach cancer Paternal Grandfather 1   Cancer Other        PGF's  brother with unknown cancer   Cancer Other        MGM's brother with unknown cancer   Crohn's disease Neg Hx    Rectal cancer Neg Hx     Social History   Socioeconomic History   Marital status: Married    Spouse name: david    Number of children: 2   Years of education: Not on file   Highest education level: Not on file  Occupational History   Occupation: Armed forces technical officer: [employer]  Tobacco Use   Smoking status: Never    Passive exposure: Past (as achild,mother smoked)   Smokeless tobacco: Never  Vaping Use   Vaping Use: Never used  Substance and Sexual Activity   Alcohol use: Yes    Comment: 1 or less daily wine   Drug use: No   Sexual activity: Not on file  Other Topics Concern   Not on file  Social History Narrative   Not on file   Social Determinants of Health   Financial Resource Strain: Not on file  Food Insecurity: Not on file  Transportation Needs: Not on file  Physical Activity: Not on file  Stress: Not on file  Social Connections: Not on file  Intimate Partner Violence: Not on file    Physical Exam: Vital signs in last 24 hours: '@BP'$  115/67   Pulse 88   Temp 98.6 F (37 C)   Resp 13   Ht '5\' 5"'$  (1.651 m)   Wt 152 lb 12.8 oz (69.3 kg)   SpO2 97%   BMI 25.43 kg/m  GEN: NAD EYE: Sclerae anicteric ENT: MMM CV: Non-tachycardic Pulm: CTA b/l GI: Soft, NT/ND NEURO:  Alert & Oriented x 3   Zenovia Jarred, MD [facility]  08/03/2022 9:46 AM

## 2022-08-03 NOTE — Op Note (Signed)
[facility] Patient Name: Kylie Miller Procedure Date: 08/03/2022 9:42 AM MRN: [id number] Endoscopist: Jerene Bears , MD Age: 56 Referring MD:  Date of Birth: [birth year] Gender: Female Account #: [id number] Procedure:                Colonoscopy Indications:              Screening for colorectal malignant neoplasm, Last                            colonoscopy 10 years ago Medicines:                Monitored Anesthesia Care Procedure:                Pre-Anesthesia Assessment:                           - Prior to the procedure, a History and Physical                            was performed, and patient medications and                            allergies were reviewed. The patient's tolerance of                            previous anesthesia was also reviewed. The risks                            and benefits of the procedure and the sedation                            options and risks were discussed with the patient.                            All questions were answered, and informed consent                            was obtained. Prior Anticoagulants: The patient has                            taken no previous anticoagulant or antiplatelet                            agents. ASA Grade Assessment: II - A patient with                            mild systemic disease. After reviewing the risks                            and benefits, the patient was deemed in                            satisfactory condition to undergo the procedure.  After obtaining informed consent, the colonoscope                            was passed under direct vision. Throughout the                            procedure, the patient's blood pressure, pulse, and                            oxygen saturations were monitored continuously. The                            Olympus PCF-H190DL ([id number]) Colonoscope was                            introduced through the anus and advanced  to the                            cecum, identified by appendiceal orifice and                            ileocecal valve. The colonoscopy was performed                            without difficulty. The patient tolerated the                            procedure well. The quality of the bowel                            preparation was good. The ileocecal valve,                            appendiceal orifice, and rectum were photographed. Scope In: 9:53:08 AM Scope Out: 10:08:03 AM Scope Withdrawal Time: 0 hours 9 minutes 40 seconds  Total Procedure Duration: 0 hours 14 minutes 55 seconds  Findings:                 The digital rectal exam was normal.                           The entire examined colon appeared normal on direct                            and retroflexion views. Complications:            No immediate complications. Estimated Blood Loss:     Estimated blood loss: none. Impression:               - The entire examined colon is normal on direct and                            retroflexion views.                           - No specimens collected. Recommendation:           -  Patient has a contact number available for                            emergencies. The signs and symptoms of potential                            delayed complications were discussed with the                            patient. Return to normal activities tomorrow.                            Written discharge instructions were provided to the                            patient.                           - Resume previous diet.                           - Continue present medications.                           - Repeat colonoscopy in 10 years for screening                            purposes. Jerene Bears, MD 08/03/2022 10:09:53 AM This report has been signed electronically.

## 2022-08-03 NOTE — Patient Instructions (Signed)
Repeat colonoscopy in 10 years for screening purposes.   YOU HAD AN ENDOSCOPIC PROCEDURE TODAY AT [facility]:   Refer to the procedure report that was given to you for any specific questions about what was found during the examination.  If the procedure report does not answer your questions, please call your gastroenterologist to clarify.  If you requested that your care partner not be given the details of your procedure findings, then the procedure report has been included in a sealed envelope for you to review at your convenience later.  YOU SHOULD EXPECT: Some feelings of bloating in the abdomen. Passage of more gas than usual.  Walking can help get rid of the air that was put into your GI tract during the procedure and reduce the bloating. If you had a lower endoscopy (such as a colonoscopy or flexible sigmoidoscopy) you may notice spotting of blood in your stool or on the toilet paper. If you underwent a bowel prep for your procedure, you may not have a normal bowel movement for a few days.  Please Note:  You might notice some irritation and congestion in your nose or some drainage.  This is from the oxygen used during your procedure.  There is no need for concern and it should clear up in a day or so.  SYMPTOMS TO REPORT IMMEDIATELY:  Following lower endoscopy (colonoscopy or flexible sigmoidoscopy):  Excessive amounts of blood in the stool  Significant tenderness or worsening of abdominal pains  Swelling of the abdomen that is new, acute  Fever of 100F or higher  For urgent or emergent issues, a gastroenterologist can be reached at any hour by calling [phone]. Do not use MyChart messaging for urgent concerns.    DIET:  We do recommend a small meal at first, but then you may proceed to your regular diet.  Drink plenty of fluids but you should avoid alcoholic beverages for 24 hours.  ACTIVITY:  You should plan to take it easy for the rest of today and you should  NOT DRIVE or use heavy machinery until tomorrow (because of the sedation medicines used during the test).    FOLLOW UP: Our staff will call the number listed on your records the next business day following your procedure.  We will call around 7:15- 8:00 am to check on you and address any questions or concerns that you may have regarding the information given to you following your procedure. If we do not reach you, we will leave a message.  If you develop any symptoms (ie: fever, flu-like symptoms, shortness of breath, cough etc.) before then, please call [phone].  If you test positive for Covid 19 in the 2 weeks post procedure, please call and report this information to [place].    If any biopsies were taken you will be contacted by phone or by letter within the next 1-3 weeks.  Please call us at [phone] if you have not heard about the biopsies in 3 weeks.    SIGNATURES/CONFIDENTIALITY: You and/or your care partner have signed paperwork which will be entered into your electronic medical record.  These signatures attest to the fact that that the information above on your After Visit Summary has been reviewed and is understood.  Full responsibility of the confidentiality of this discharge information lies with you and/or your care-partner.

## 2022-08-03 NOTE — Progress Notes (Signed)
Report given to PACU, vss 

## 2022-08-04 ENCOUNTER — Telehealth: Payer: Self-pay

## 2022-08-04 NOTE — Telephone Encounter (Signed)
  Follow up Call-     08/03/2022    8:47 AM  Call back number  Post procedure Call Back phone  # [phone]  Permission to leave phone message Yes     Patient questions:  Do you have a fever, pain , or abdominal swelling? No. Pain Score  0 *  Have you tolerated food without any problems? Yes.    Have you been able to return to your normal activities? Yes.    Do you have any questions about your discharge instructions: Diet   No. Medications  No. Follow up visit  No.  Do you have questions or concerns about your Care? No.  Actions: * If pain score is 4 or above: No action needed, pain <4.

## 2022-09-08 LAB — HM MAMMOGRAPHY

## 2022-09-12 ENCOUNTER — Encounter: Payer: Self-pay | Admitting: *Deleted

## 2022-11-30 ENCOUNTER — Encounter: Payer: Self-pay | Admitting: Physician Assistant

## 2022-11-30 ENCOUNTER — Ambulatory Visit (INDEPENDENT_AMBULATORY_CARE_PROVIDER_SITE_OTHER): Payer: 59 | Admitting: Physician Assistant

## 2022-11-30 VITALS — BP 110/70 | HR 70 | Temp 97.7°F | Ht 65.5 in | Wt 154.0 lb

## 2022-11-30 DIAGNOSIS — I2583 Coronary atherosclerosis due to lipid rich plaque: Secondary | ICD-10-CM

## 2022-11-30 DIAGNOSIS — F5101 Primary insomnia: Secondary | ICD-10-CM

## 2022-11-30 DIAGNOSIS — E7800 Pure hypercholesterolemia, unspecified: Secondary | ICD-10-CM | POA: Diagnosis not present

## 2022-11-30 DIAGNOSIS — Z114 Encounter for screening for human immunodeficiency virus [HIV]: Secondary | ICD-10-CM | POA: Diagnosis not present

## 2022-11-30 DIAGNOSIS — Z1159 Encounter for screening for other viral diseases: Secondary | ICD-10-CM | POA: Diagnosis not present

## 2022-11-30 DIAGNOSIS — Z0000 Encounter for general adult medical examination without abnormal findings: Secondary | ICD-10-CM

## 2022-11-30 DIAGNOSIS — I2510 Atherosclerotic heart disease of native coronary artery without angina pectoris: Secondary | ICD-10-CM

## 2022-11-30 LAB — COMPREHENSIVE METABOLIC PANEL
ALT: 26 U/L (ref 0–35)
AST: 26 U/L (ref 0–37)
Albumin: 4.3 g/dL (ref 3.5–5.2)
Alkaline Phosphatase: 63 U/L (ref 39–117)
BUN: 24 mg/dL — ABNORMAL HIGH (ref 6–23)
CO2: 31 mEq/L (ref 19–32)
Calcium: 9.9 mg/dL (ref 8.4–10.5)
Chloride: 104 mEq/L (ref 96–112)
Creatinine, Ser: 0.67 mg/dL (ref 0.40–1.20)
GFR: 96.43 mL/min (ref >60.00)
Glucose, Bld: 86 mg/dL (ref 70–99)
Potassium: 4.8 mEq/L (ref 3.5–5.1)
Sodium: 141 mEq/L (ref 135–145)
Total Bilirubin: 0.4 mg/dL (ref 0.2–1.2)
Total Protein: 7 g/dL (ref 6.0–8.3)

## 2022-11-30 LAB — CBC WITH DIFFERENTIAL/PLATELET
Basophils Absolute: 0.1 10*3/uL (ref 0.0–0.1)
Basophils Relative: 1 % (ref 0.0–3.0)
Eosinophils Absolute: 0.2 10*3/uL (ref 0.0–0.7)
Eosinophils Relative: 3.5 % (ref 0.0–5.0)
HCT: 37.9 % (ref 36.0–46.0)
Hemoglobin: 12.7 g/dL (ref 12.0–15.0)
Lymphocytes Relative: 35.2 % (ref 12.0–46.0)
Lymphs Abs: 1.8 10*3/uL (ref 0.7–4.0)
MCHC: 33.6 g/dL (ref 30.0–36.0)
MCV: 90.9 fl (ref 78.0–100.0)
Monocytes Absolute: 0.5 10*3/uL (ref 0.1–1.0)
Monocytes Relative: 9.8 % (ref 3.0–12.0)
Neutro Abs: 2.6 10*3/uL (ref 1.4–7.7)
Neutrophils Relative %: 50.5 % (ref 43.0–77.0)
Platelets: 307 10*3/uL (ref 150.0–400.0)
RBC: 4.16 Mil/uL (ref 3.87–5.11)
RDW: 13.5 % (ref 11.5–15.5)
WBC: 5.2 10*3/uL (ref 4.0–10.5)

## 2022-11-30 LAB — LIPID PANEL
Cholesterol: 208 mg/dL — ABNORMAL HIGH (ref 0–200)
HDL: 89.3 mg/dL (ref >39.00)
LDL Cholesterol: 92 mg/dL (ref 0–99)
NonHDL: 118.89
Total CHOL/HDL Ratio: 2
Triglycerides: 132 mg/dL (ref 0.0–149.0)
VLDL: 26.4 mg/dL (ref 0.0–40.0)

## 2022-11-30 LAB — VITAMIN B12: Vitamin B-12: 639 pg/mL (ref 211–911)

## 2022-11-30 MED ORDER — TRAZODONE HCL 50 MG PO TABS: 25.0000 mg | ORAL_TABLET | Freq: Every evening | ORAL | 1 refills | Status: DC | PRN

## 2022-11-30 MED ORDER — ALPRAZOLAM 0.5 MG PO TABS
ORAL_TABLET | ORAL | 0 refills | Status: DC
Start: 2022-11-30 — End: 2023-05-17

## 2022-11-30 NOTE — Patient Instructions (Signed)
It was great to see you!  Try the 1/2 tablet of trazodone ('25mg'$ ) -- take 30 min prior to bed After taking this nightly for a few nights, may increase by 1/2 tablet up to 100 mg  Message me with any concerns  Please go to the lab for blood work.   Our office will call you with your results unless you have chosen to receive results via MyChart.  If your blood work is normal we will follow-up each year for physicals and as scheduled for chronic medical problems.  If anything is abnormal we will treat accordingly and get you in for a follow-up.  Take care,  Aldona Bar

## 2022-11-30 NOTE — Progress Notes (Signed)
Subjective:    Kylie Miller is a 56 y.o. female and is here for a comprehensive physical exam.  HPI  Health Maintenance Due  Topic Date Due   HIV Screening  Never done   Hepatitis C Screening  Never done   Zoster Vaccines- Shingrix (1 of 2) Never done   MAMMOGRAM  07/03/2020   DEXA SCAN  07/02/2021    Acute Concerns: None  Chronic Issues: HLD [name] is compliant with taking crestor 20 mg daily with no complications. She is regularly following up with Dr. Gwenlyn Found, Cardiology- LDL not at goal of less than 70.  Lab Results  Component Value Date   CHOL 177 04/22/2022   HDL 80 04/22/2022   LDLCALC 87 04/22/2022   LDLDIRECT 140.6 04/16/2008   TRIG 52 04/22/2022   CHOLHDL 2.2 04/22/2022    CAD Diagnosed in 2020- Coronary calcium score of 113 at that time. She is compliant with Crestor '40mg'$  daily. She is following up with Dr. Gwenlyn Found, cardiology for this issue and is managing well.   Health Maintenance: Immunizations -- UTD Colonoscopy -- 08/03/22: The entire examined colon is normal on direct and retroflexion views. Advised on 10 year recall. Mammogram -- 08/2022: No evidence of malignancy PAP -- Last 07/28/2020, now s/p hysterectomy Bone Density -- Last 07/03/2019: normal --due in 5 years per her gynecologist recommendation Diet -- She was following a strict Mediterranean diet earlier this year but has not been doing this recently. She has plans to try this again. Exercise -- Walks 10K steps a day, would like to resume weight exercises now that she has more free time following improvement of her father's health.  Sleep habits -- She was only sleeping for 1-1.5 hours before waking up with racing thoughts about tasks that needed to be done. After using her iPad to complete tasks and play games she would sleep for another 3-4 hours.  Takes Xanax 0.'25mg'$  at bedtime most nights with improvement of anxiety and sleep. She tried Trazodone with improvement of sleep in the past. She  also tried Melatonin without relief. Nyquil helped with sleep but made her very groggy in the mornings. Reports avoiding screen time before bed. Mood -- Anxiety/insomnia, particularly with caring for her father. However, he has completed treatment and she feels that her symptoms may improve now.  UTD with dentist? - Yes UTD with eye doctor? - Yes  Weight history: Wt Readings from Last 10 Encounters:  11/30/22 154 lb (69.9 kg)  08/03/22 152 lb 12.8 oz (69.3 kg)  07/13/22 152 lb 12.8 oz (69.3 kg)  11/29/21 152 lb (68.9 kg)  11/23/21 154 lb 9.6 oz (70.1 kg)  11/27/20 147 lb (66.7 kg)  06/03/20 142 lb (64.4 kg)  03/24/20 146 lb (66.2 kg)  12/25/19 153 lb 6.4 oz (69.6 kg)  11/20/19 149 lb 4 oz (67.7 kg)   Body mass index is 25.24 kg/m. No LMP recorded. Patient has had a hysterectomy.  Alcohol use:  reports current alcohol use.  Tobacco use:  Tobacco Use: Low Risk  (11/30/2022)   Patient History    Smoking Tobacco Use: Never    Smokeless Tobacco Use: Never    Passive Exposure: Past   Eligible for lung cancer screening? no     11/30/2022    8:42 AM  Depression screen PHQ 2/9  Decreased Interest 0  Down, Depressed, Hopeless 0  PHQ - 2 Score 0     Other providers/specialists: Patient Care Team: Inda Coke, [place] as PCP -  General (Physician Assistant) Lorretta Harp, MD as PCP - Cardiology (Cardiology) Everlene Farrier, MD as Consulting Physician (Obstetrics and Gynecology)    PMHx, SurgHx, SocialHx, Medications, and Allergies were reviewed in the Visit Navigator and updated as appropriate.   Past Medical History:  Diagnosis Date   Allergic rhinitis    Anxiety    "WHEN GET ON PLANE"   Family history of breast cancer    Family history of colon cancer    Family history of pancreatic cancer    Family history of thyroid cancer    GERD (gastroesophageal reflux disease)    Heart murmur    NO ISSUES WITH IT   Hyperlipidemia    family history   IBS (irritable  bowel syndrome)    resolved   Lumbar back pain    Vaginal delivery 1992     Past Surgical History:  Procedure Laterality Date   CESAREAN SECTION  1996   COLONOSCOPY     VAGINAL HYSTERECTOMY       Family History  Problem Relation Age of Onset   Breast cancer Mother 13   Pancreatic cancer Mother 1   Skin cancer Father        Basal cell that required Mohs and radiation   Dementia Father    Thyroid cancer Maternal Grandmother        dx in her 4s   Heart disease Maternal Grandfather    Esophageal cancer Paternal Grandmother 59   Heart attack Paternal Grandmother 62   Stomach cancer Paternal Grandfather 49   Colon polyps Maternal Aunt    Colon cancer Maternal Aunt        dx between 58-70   Heart disease Paternal Aunt    Colon polyps Paternal Uncle    Colon cancer Paternal Uncle        dx late 60s-70   Colon polyps Paternal Uncle    Colon cancer Paternal Uncle    Cancer Other        PGF's brother with unknown cancer   Cancer Other        MGM's brother with unknown cancer   Crohn's disease Neg Hx    Rectal cancer Neg Hx     Social History   Tobacco Use   Smoking status: Never    Passive exposure: Past (as achild,mother smoked)   Smokeless tobacco: Never  Vaping Use   Vaping Use: Never used  Substance Use Topics   Alcohol use: Yes    Comment: 1 or less daily wine   Drug use: No    Review of Systems:   Review of Systems  Constitutional:  Negative for chills, fever, malaise/fatigue and weight loss.  HENT:  Negative for hearing loss, sinus pain and sore throat.   Respiratory:  Negative for cough and hemoptysis.   Cardiovascular:  Negative for chest pain, palpitations, leg swelling and PND.  Gastrointestinal:  Negative for abdominal pain, constipation, diarrhea, heartburn, nausea and vomiting.  Genitourinary:  Negative for dysuria, frequency and urgency.  Musculoskeletal:  Negative for back pain, myalgias and neck pain.  Skin:  Negative for itching and rash.   Neurological:  Negative for dizziness, tingling, seizures and headaches.  Endo/Heme/Allergies:  Negative for polydipsia.  Psychiatric/Behavioral:  Negative for depression. The patient is nervous/anxious and has insomnia.     Objective:   BP 110/70 (BP Location: Left Arm, Patient Position: Sitting, Cuff Size: Normal)   Pulse 70   Temp 97.7 F (36.5 C) (Temporal)   Ht 5' 5.5" (  1.664 m)   Wt 154 lb (69.9 kg)   SpO2 96%   BMI 25.24 kg/m  Body mass index is 25.24 kg/m.   General Appearance:    Alert, cooperative, no distress, appears stated age  Head:    Normocephalic, without obvious abnormality, atraumatic  Eyes:    PERRL, conjunctiva/corneas clear, EOM's intact, fundi    benign, both eyes  Ears:    Normal TM's and external ear canals, both ears  Nose:   Nares normal, septum midline, mucosa normal, no drainage    or sinus tenderness  Throat:   Lips, mucosa, and tongue normal; teeth and gums normal  Neck:   Supple, symmetrical, trachea midline, no adenopathy;    thyroid:  no enlargement/tenderness/nodules; no carotid   bruit or JVD  Back:     Symmetric, no curvature, ROM normal, no CVA tenderness  Lungs:     Clear to auscultation bilaterally, respirations unlabored  Chest Wall:    No tenderness or deformity   Heart:    Regular rate and rhythm, S1 and S2 normal, no murmur, rub or gallop  Breast Exam:    Deferred   Abdomen:     Soft, non-tender, bowel sounds active all four quadrants,    no masses, no organomegaly  Genitalia:  Deferred   Extremities:   Extremities normal, atraumatic, no cyanosis or edema  Pulses:   2+ and symmetric all extremities  Skin:   Skin color, texture, turgor normal, no rashes or lesions  Lymph nodes:   Cervical, supraclavicular, and axillary nodes normal  Neurologic:   CNII-XII intact, normal strength, sensation and reflexes    throughout    Assessment/Plan:   Routine physical examination Today patient counseled on age appropriate routine health  concerns for screening and prevention, each reviewed and up to date or declined. Immunizations reviewed and up to date or declined. Labs ordered and reviewed. Risk factors for depression reviewed and negative. Hearing function and visual acuity are intact. ADLs screened and addressed as needed. Functional ability and level of safety reviewed and appropriate. Education, counseling and referrals performed based on assessed risks today. Patient provided with a copy of personalized plan for preventive services.  HYPERCHOLESTEROLEMIA Update lipid panel and provide recommendations accordingly  Screening for HIV (human immunodeficiency virus) Update HIV  Encounter for screening for other viral diseases Update hep C  Coronary artery disease due to lipid rich plaque Update lipid panel and provide recommendations accordingly, management per cardiology  Primary insomnia Uncontrolled Discussed that use of benzos for sleep is not ideal and can have concerning consequences with long-term use We will trial trazodone 25 to 50 mg nightly, I did give her permission to go up to 100 mg nightly if needed I asked her to send me a mychart message on how this works for her  [employer] as a Education administrator for [employer], PA.,have documented all relevant documentation on the behalf of Inda Coke, PA,as directed by  Inda Coke, PA while in the presence of Inda Coke, [place].  I, Inda Coke, [place], have reviewed all documentation for this visit. The documentation on 11/30/22 for the exam, diagnosis, procedures, and orders are all accurate and complete.  Inda Coke, PA-C [place]

## 2022-12-01 LAB — HEPATITIS C ANTIBODY: Hepatitis C Ab: NONREACTIVE

## 2022-12-01 LAB — HIV ANTIBODY (ROUTINE TESTING W REFLEX): HIV 1&2 Ab, 4th Generation: NONREACTIVE

## 2022-12-02 ENCOUNTER — Encounter: Payer: Self-pay | Admitting: Physician Assistant

## 2022-12-05 ENCOUNTER — Encounter: Payer: Self-pay | Admitting: Physician Assistant

## 2022-12-22 ENCOUNTER — Other Ambulatory Visit: Payer: Self-pay | Admitting: Physician Assistant

## 2023-01-10 ENCOUNTER — Encounter: Payer: Self-pay | Admitting: Physician Assistant

## 2023-01-10 ENCOUNTER — Ambulatory Visit: Payer: 59 | Admitting: Cardiovascular Disease

## 2023-01-18 ENCOUNTER — Ambulatory Visit: Payer: 59 | Attending: Cardiovascular Disease | Admitting: Cardiovascular Disease

## 2023-01-18 ENCOUNTER — Encounter: Payer: Self-pay | Admitting: Cardiovascular Disease

## 2023-01-18 VITALS — BP 106/62 | HR 58 | Ht 66.0 in | Wt 149.6 lb

## 2023-01-18 DIAGNOSIS — E7800 Pure hypercholesterolemia, unspecified: Secondary | ICD-10-CM

## 2023-01-18 DIAGNOSIS — I2583 Coronary atherosclerosis due to lipid rich plaque: Secondary | ICD-10-CM

## 2023-01-18 DIAGNOSIS — I2510 Atherosclerotic heart disease of native coronary artery without angina pectoris: Secondary | ICD-10-CM

## 2023-01-18 DIAGNOSIS — R002 Palpitations: Secondary | ICD-10-CM

## 2023-01-18 NOTE — Patient Instructions (Signed)
Medication Instructions:  Your physician recommends that you continue on your current medications as directed. Please refer to the Current Medication list given to you today.  *If you need a refill on your cardiac medications before your next appointment, please call your pharmacy*   Lab Work: Your physician recommends that you have labs drawn today: Lipid/Liver panel  If you have labs (blood work) drawn today and your tests are completely normal, you will receive your results only by: MyChart Message (if you have MyChart) OR A paper copy in the mail If you have any lab test that is abnormal or we need to change your treatment, we will call you to review the results.   Follow-Up: At [facility], you and your health needs are our priority.  As part of our continuing mission to provide you with exceptional heart care, we have created designated Provider Care Teams.  These Care Teams include your primary Cardiologist (physician) and Advanced Practice Providers (APPs -  Physician Assistants and Nurse Practitioners) who all work together to provide you with the care you need, when you need it.  We recommend signing up for the patient portal called "MyChart".  Sign up information is provided on this After Visit Summary.  MyChart is used to connect with patients for Virtual Visits (Telemedicine).  Patients are able to view lab/test results, encounter notes, upcoming appointments, etc.  Non-urgent messages can be sent to your provider as well.   To learn more about what you can do with MyChart, go to [email or web address].    Your next appointment:   12 month(s)  Provider:   Quay Burow, MD

## 2023-01-18 NOTE — Progress Notes (Signed)
01/18/2023 Kylie Miller   1966-02-15  [id number]  Primary Physician Inda Coke, PA Primary Cardiologist: Lorretta Harp MD [name], [place]  HPI:  Kylie Miller is a 57 y.o.  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  She was referred by Inda Coke, PA-C for elevated coronary calcium score.  I last saw her in the office 11/23/2021. Her only risk factor for CAD is hyperlipidemia on statin therapy.  She does not smoke.  She drinks wine daily.  There is no family history for heart disease.  She is never had a heart tach or stroke.  She denies chest pain or shortness of breath.  Recent coronary calcium score performed 12/05/2019 was 113.  Her most recent LDL on simvastatin 20 was 145 on 11/20/2019.  She complains of occasional "flutters "" in her chest although she does drink over 4 cups of caffeinated beverages a day.   She decreased her caffeine intake at my request her palpitations have resolved.  She did change her simvastatin to rosuvastatin because of elevated LDL in the setting of moderately elevated coronary calcium score which resulted in marked improvement in her lipid profile.  This was performed 03/03/2020 revealing a total cholesterol of 179, LDL of 82 and HDL of 84.   Since I saw her a year and a half ago she continues to do well.  She is very active and walks over 10,000 steps a day and does do other aerobic activity.  She denies chest pain or shortness of breath.  Her most recent lipid profile performed 11/30/2022 revealed total cholesterol of 208, LDL of 92 and HDL of 89 on rosuvastatin 40 mg a day.  Current Meds  Medication Sig   ALPRAZolam (XANAX) 0.5 MG tablet TAKE 1 TABLET BY MOUTH AT  BEDTIME AS NEEDED FOR  ANXIETY   calcium carbonate (OS-CAL) 1250 (500 Ca) MG chewable tablet Chew 1  tablet by mouth daily.   Cholecalciferol (VITAMIN D3) 50 MCG (2000 UT) TABS Take by mouth daily.   folic acid (FOLVITE) 196 MCG tablet Take 400 mcg by mouth daily.   glucosamine-chondroitin 500-400 MG tablet Take 1 tablet by mouth at bedtime. 2000 mg   Multiple Vitamins-Minerals (MULTIVITAMIN WITH MINERALS) tablet Take 1 tablet by mouth daily.   rosuvastatin (CRESTOR) 40 MG tablet Take 1 tablet (40 mg total) by mouth daily.     Allergies  Allergen Reactions   Codeine     REACTION: nausea    Social History   Socioeconomic History   Marital status: Married    Spouse name: david    Number of children: 2   Years of education: Not on file   Highest education level: Not on file  Occupational History   Occupation: Armed forces technical officer: [employer]  Tobacco Use   Smoking status: Never    Passive exposure: Past (as achild,mother smoked)   Smokeless tobacco: Never  Vaping Use   Vaping Use: Never used  Substance and Sexual Activity   Alcohol use: Yes    Comment: 1 or less daily wine   Drug use: No   Sexual activity: Not on file  Other Topics Concern   Not on file  Social History Narrative   Not on file   Social Determinants of Radio broadcast assistant  Strain: Not on file  Food Insecurity: Not on file  Transportation Needs: Not on file  Physical Activity: Not on file  Stress: Not on file  Social Connections: Not on file  Intimate Partner Violence: Not on file     Review of Systems: General: negative for chills, fever, night sweats or weight changes.  Cardiovascular: negative for chest pain, dyspnea on exertion, edema, orthopnea, palpitations, paroxysmal nocturnal dyspnea or shortness of breath Dermatological: negative for rash Respiratory: negative for cough or wheezing Urologic: negative for hematuria Abdominal: negative for nausea, vomiting, diarrhea, bright red blood per rectum, melena, or hematemesis Neurologic: negative for visual changes,  syncope, or dizziness All other systems reviewed and are otherwise negative except as noted above.    Blood pressure 106/62, pulse (!) 58, height '5\' 6"'$  (1.676 m), weight 149 lb 9.6 oz (67.9 kg), SpO2 99 %.  General appearance: alert and no distress Neck: no adenopathy, no carotid bruit, no JVD, supple, symmetrical, trachea midline, and thyroid not enlarged, symmetric, no tenderness/mass/nodules Lungs: clear to auscultation bilaterally Heart: regular rate and rhythm, S1, S2 normal, no murmur, click, rub or gallop Extremities: extremities normal, atraumatic, no cyanosis or edema Pulses: 2+ and symmetric Skin: Skin color, texture, turgor normal. No rashes or lesions Neurologic: Grossly normal  EKG sinus bradycardia at 58 without ST or T wave changes.  Personally reviewed this EKG.  ASSESSMENT AND PLAN:   HYPERCHOLESTEROLEMIA History of hyperlipidemia on high-dose rosuvastatin with lipid profile performed 11/30/2022 revealing total cholesterol 208, LDL 92 and HDL 89.  She is not at goal for secondary prevention (LDL less than 70).  She has changed her diet since that time.  Will recheck a lipid and liver profile today.  If she still not at goal we will decide whether to add Zetia and/or begin a PCSK9.  CAD (coronary artery disease) History of CAD with coronary calcium score performed 12/05/2019 which was 113.  She is completely asymptomatic.  Palpitations Resolved     Lorretta Harp MD FACP,FACC,FAHA, [facility] 01/18/2023 10:26 AM

## 2023-01-18 NOTE — Assessment & Plan Note (Signed)
Resolved

## 2023-01-18 NOTE — Assessment & Plan Note (Signed)
History of CAD with coronary calcium score performed 12/05/2019 which was 113.  She is completely asymptomatic.

## 2023-01-18 NOTE — Assessment & Plan Note (Signed)
History of hyperlipidemia on high-dose rosuvastatin with lipid profile performed 11/30/2022 revealing total cholesterol 208, LDL 92 and HDL 89.  She is not at goal for secondary prevention (LDL less than 70).  She has changed her diet since that time.  Will recheck a lipid and liver profile today.  If she still not at goal we will decide whether to add Zetia and/or begin a PCSK9.

## 2023-01-19 ENCOUNTER — Other Ambulatory Visit: Payer: Self-pay | Admitting: *Deleted

## 2023-01-19 ENCOUNTER — Telehealth: Payer: Self-pay | Admitting: Cardiovascular Disease

## 2023-01-19 DIAGNOSIS — E7800 Pure hypercholesterolemia, unspecified: Secondary | ICD-10-CM

## 2023-01-19 LAB — HEPATIC FUNCTION PANEL
ALT: 30 IU/L (ref 0–32)
AST: 31 IU/L (ref 0–40)
Albumin: 4.8 g/dL (ref 3.8–4.9)
Alkaline Phosphatase: 74 IU/L (ref 44–121)
Bilirubin Total: 0.5 mg/dL (ref 0.0–1.2)
Bilirubin, Direct: 0.15 mg/dL (ref 0.00–0.40)
Total Protein: 7.1 g/dL (ref 6.0–8.5)

## 2023-01-19 LAB — LIPID PANEL
Chol/HDL Ratio: 2.4 ratio (ref 0.0–4.4)
Cholesterol, Total: 198 mg/dL (ref 100–199)
HDL: 81 mg/dL (ref >39)
LDL Chol Calc (NIH): 108 mg/dL — ABNORMAL HIGH (ref 0–99)
Triglycerides: 46 mg/dL (ref 0–149)
VLDL Cholesterol Cal: 9 mg/dL (ref 5–40)

## 2023-01-19 MED ORDER — ROSUVASTATIN CALCIUM 40 MG PO TABS: 40.0000 mg | ORAL_TABLET | Freq: Every day | ORAL | 0 refills | Status: DC

## 2023-01-19 NOTE — Telephone Encounter (Signed)
Pt c/o medication issue:  1. Name of Medication:   rosuvastatin (CRESTOR) 40 MG tablet    2. How are you currently taking this medication (dosage and times per day)? As directed  3. Are you having a reaction (difficulty breathing--STAT)? no  4. What is your medication issue? Called patient to schedule PharmD referral for her cholesterol, the next available appointment is on 02/10/23. I have scheduled her for this day but she states that she runs out of medication in a week and she is not sure if Dr. Gwenlyn Found wants her to refill this medication since it does not seem to be working. Please advise.

## 2023-01-19 NOTE — Telephone Encounter (Signed)
RN informed  patient to keep taking Rosuvastatin  until appointment. She maybe continuing this medication puls an additiona medication.  Patient  would like a 90 day supply sent to [facility] on [address].  RN  e-sent medication  patient verbalized understanding.

## 2023-02-10 ENCOUNTER — Telehealth: Payer: Self-pay | Admitting: Pharmacist

## 2023-02-10 ENCOUNTER — Ambulatory Visit: Payer: 59 | Attending: Cardiovascular Disease | Admitting: Student

## 2023-02-10 ENCOUNTER — Encounter: Payer: Self-pay | Admitting: Student

## 2023-02-10 DIAGNOSIS — E7800 Pure hypercholesterolemia, unspecified: Secondary | ICD-10-CM | POA: Diagnosis not present

## 2023-02-10 NOTE — Progress Notes (Unsigned)
Patient ID: AISOSA VIELMA                 DOB: 23-Oct-1966                    MRN: [id number]      HPI: Kylie Miller is a 57 y.o. female patient referred to lipid clinic by [facility]. PMH is significant for CAD, IBS,    Reviewed options for lowering LDL cholesterol, including ezetimibe, PCSK-9 inhibitors, bempedoic acid and inclisiran.  Discussed mechanisms of action, dosing, side effects and potential decreases in LDL cholesterol.  Also reviewed cost information and potential options for patient assistance.   Current Medications: rosuvastatin 40 mg daily  Intolerances: atorvastatin 20 mg, simvastatin 20 mg and 40 mg  Risk Factors: CAD, Coronary calcium score of 113  LDL goal: <70 mg/dl  Last lipid lab TC: 198, TG:46 HDL: 81 LDLc:108 (01/18/2023)  Diet:   Exercise:   Family History:   Social History:   Labs: Lipid Panel     Component Value Date/Time   CHOL 198 01/18/2023 1034   TRIG 46 01/18/2023 1034   TRIG 46 01/01/2007 0904   HDL 81 01/18/2023 1034   CHOLHDL 2.4 01/18/2023 1034   CHOLHDL 2 11/30/2022 0917   VLDL 26.4 11/30/2022 0917   LDLCALC 108 (H) 01/18/2023 1034   [place] 100 (H) 11/27/2020 0933   LDLDIRECT 140.6 04/16/2008 0905   LABVLDL 9 01/18/2023 1034    Past Medical History:  Diagnosis Date   Allergic rhinitis    Anxiety    "WHEN GET ON PLANE"   Family history of breast cancer    Family history of colon cancer    Family history of pancreatic cancer    Family history of thyroid cancer    GERD (gastroesophageal reflux disease)    Heart murmur    NO ISSUES WITH IT   Hyperlipidemia    family history   IBS (irritable bowel syndrome)    resolved   Lumbar back pain    Vaginal delivery 1992    Current Outpatient Medications on File Prior to Visit  Medication Sig Dispense Refill   ALPRAZolam (XANAX) 0.5 MG tablet TAKE 1 TABLET BY MOUTH AT  BEDTIME AS NEEDED FOR  ANXIETY 30 tablet 0   calcium carbonate (OS-CAL) 1250 (500 Ca) MG chewable tablet Chew  1 tablet by mouth daily.     Cholecalciferol (VITAMIN D3) 50 MCG (2000 UT) TABS Take by mouth daily.     folic acid (FOLVITE) A999333 MCG tablet Take 400 mcg by mouth daily.     glucosamine-chondroitin 500-400 MG tablet Take 1 tablet by mouth at bedtime. 2000 mg     Multiple Vitamins-Minerals (MULTIVITAMIN WITH MINERALS) tablet Take 1 tablet by mouth daily.     rosuvastatin (CRESTOR) 40 MG tablet Take 1 tablet (40 mg total) by mouth daily. 90 tablet 0   traZODone (DESYREL) 50 MG tablet TAKE 0.5-1 TABLETS BY MOUTH AT BEDTIME AS NEEDED FOR SLEEP. (Patient not taking: Reported on 01/18/2023) 90 tablet 1   No current facility-administered medications on file prior to visit.    Allergies  Allergen Reactions   Codeine     REACTION: nausea    Assessment/Plan:  1. Hyperlipidemia -  No problems updated. No problem-specific Assessment & Plan notes found for this encounter.    Thank you,  Cammy Copa, Pharm.D [facility] Division of [facility] [address] [place], [place] [address]  Phone: [phone]; Fax: [phone]

## 2023-02-10 NOTE — Patient Instructions (Signed)
Your Results:             Your most recent labs Goal  Total Cholesterol 198 < 200  Triglycerides 46 < 150  HDL (happy/good cholesterol) 81 > 40  LDL (lousy/bad cholesterol 108 < 70   Medication changes: We will start the process to get PCSK9i (Repatha or Praluent) covered by your insurance.  Once the prior authorization is complete, we will call you to let you know and confirm pharmacy information.      Praluent is a cholesterol medication that improved your body's ability to get rid of "bad cholesterol" known as LDL. It can lower your LDL up to 60%. It is an injection that is given under the skin every 2 weeks. The most common side effects of Praluent include runny nose, symptoms of the common cold, rarely flu or flu-like symptoms, back/muscle pain in about 3-4% of the patients, and redness, pain, or bruising at the injection site.    Repatha is a cholesterol medication that improved your body's ability to get rid of "bad cholesterol" known as LDL. It can lower your LDL up to 60%! It is an injection that is given under the skin every 2 weeks. The most common side effects of Repatha include runny nose, symptoms of the common cold, rarely flu or flu-like symptoms, back/muscle pain in about 3-4% of the patients, and redness, pain, or bruising at the injection site.   Lab orders: We want to repeat labs after 2-3 months.  We will send you a lab order to remind you once we get closer to that time.

## 2023-02-10 NOTE — Assessment & Plan Note (Signed)
Assessment:  LDL goal: < 70 mg/dl last LDLc 108 mg/dl (12/2022) Tolerates high intensity statins well without any side effects  Reviewed options for lowering LDL cholesterol, including ezetimibe, PCSK-9 inhibitors, bempedoic acid and inclisiran Discussed mechanisms of action, dosing, side effects and potential decreases in LDL cholesterol Patient is in agreement to start PCSK9i   Plan: Continue taking current medication- Crestor 40 mg  Will apply for PA for PCSK9i; will inform patient upon approval Lipid lab due in 2-3 months after starting [facility]

## 2023-02-10 NOTE — Progress Notes (Unsigned)
Patient ID: Kylie Miller                 DOB: 1966/12/06                    MRN: [id number]      HPI: Kylie Miller is a 57 y.o. female patient referred to lipid clinic by [facility]. PMH is significant for CAD, IBS,   Patient presented today for lipid clinic. Last year Crestor dose was dose up from 20 mg to 40 mg as her LDLc was elevated. She has been taking 40 mg since Jan 2024 and tolerating it well without any side effects. She exercise regularly and eats healthy home cooked meals.       Current Medications: rosuvastatin 40 mg daily  Intolerances: atorvastatin 20 mg, muscle aches  simvastatin 20 mg and 40 mg muscle pain at the 40 mg  Risk Factors: CAD, Coronary calcium score of 113  LDL goal: <70 mg/dl  Last lipid lab TC: 198, TG:46 HDL: 81 LDLc:108 (01/18/2023)  Diet:  Lots of green vegetables  meat- grilled or baked salmon, chicken, [place]  Does not eat processed food at all  Does not eat out  often - may be once every 3 months   Exercise:  cardio- brisk walks 30 min 3 days week   weight lifting 3 days week   Family History:  Father: dementia, cholesterol  Mother: panceatic cancer   Social History:  Alcohol: 1-2 drinks twice per week Smoking: never    Labs: Lipid Panel     Component Value Date/Time   CHOL 198 01/18/2023 1034   TRIG 46 01/18/2023 1034   TRIG 46 01/01/2007 0904   HDL 81 01/18/2023 1034   CHOLHDL 2.4 01/18/2023 1034   CHOLHDL 2 11/30/2022 0917   VLDL 26.4 11/30/2022 0917   [place] 108 (H) 01/18/2023 1034   [place] 100 (H) 11/27/2020 0933   LDLDIRECT 140.6 04/16/2008 0905   LABVLDL 9 01/18/2023 1034    Past Medical History:  Diagnosis Date   Allergic rhinitis    Anxiety    "WHEN GET ON PLANE"   Family history of breast cancer    Family history of colon cancer    Family history of pancreatic cancer    Family history of thyroid cancer    GERD (gastroesophageal reflux disease)    Heart murmur    NO ISSUES WITH IT   Hyperlipidemia     family history   IBS (irritable bowel syndrome)    resolved   Lumbar back pain    Vaginal delivery 1992    Current Outpatient Medications on File Prior to Visit  Medication Sig Dispense Refill   ALPRAZolam (XANAX) 0.5 MG tablet TAKE 1 TABLET BY MOUTH AT  BEDTIME AS NEEDED FOR  ANXIETY 30 tablet 0   calcium carbonate (OS-CAL) 1250 (500 Ca) MG chewable tablet Chew 1 tablet by mouth daily.     Cholecalciferol (VITAMIN D3) 50 MCG (2000 UT) TABS Take by mouth daily.     folic acid (FOLVITE) A999333 MCG tablet Take 400 mcg by mouth daily.     glucosamine-chondroitin 500-400 MG tablet Take 1 tablet by mouth at bedtime. 2000 mg     Multiple Vitamins-Minerals (MULTIVITAMIN WITH MINERALS) tablet Take 1 tablet by mouth daily.     rosuvastatin (CRESTOR) 40 MG tablet Take 1 tablet (40 mg total) by mouth daily. 90 tablet 0   traZODone (DESYREL) 50 MG tablet TAKE 0.5-1 TABLETS BY MOUTH  AT BEDTIME AS NEEDED FOR SLEEP. (Patient not taking: Reported on 01/18/2023) 90 tablet 1   No current facility-administered medications on file prior to visit.    Allergies  Allergen Reactions   Codeine     REACTION: nausea    Assessment/Plan:  1. Hyperlipidemia -  Problem  HYPERCHOLESTEROLEMIA    Current Medications: rosuvastatin 40 mg daily  Intolerances: atorvastatin 20 mg, muscle aches  simvastatin 20 mg and 40 mg muscle pain at the 40 mg  Risk Factors: CAD, Coronary calcium score of 113  LDL goal: <70 mg/dl  Last lipid lab TC: 198, TG:46 HDL: 81 LDLc:108 (01/18/2023)     HYPERCHOLESTEROLEMIA Assessment:  LDL goal: < 70 mg/dl last LDLc 108 mg/dl (12/2022) Tolerates high intensity statins well without any side effects  Reviewed options for lowering LDL cholesterol, including ezetimibe, PCSK-9 inhibitors, bempedoic acid and inclisiran Discussed mechanisms of action, dosing, side effects and potential decreases in LDL cholesterol Patient is in agreement to start PCSK9i   Plan: Continue taking current  medication- Crestor 40 mg  Will apply for PA for PCSK9i; will inform patient upon approval Lipid lab due in 2-3 months after starting PCSK9i    Thank you,  Cammy Copa, Pharm.D [facility] Division of [facility] [address] [place], [place] [address]  Phone: [phone]; Fax: [phone]

## 2023-02-13 ENCOUNTER — Telehealth: Payer: Self-pay

## 2023-02-13 ENCOUNTER — Other Ambulatory Visit (HOSPITAL_COMMUNITY): Payer: Self-pay

## 2023-02-13 NOTE — Telephone Encounter (Signed)
Pharmacy Patient Advocate Encounter   Received notification from [facility] that prior authorization for Repatha Sureclick '140MG'$ /ML is required/requested.    PA submitted on 02/13/23 to (ins) [facility] via CoverMyMeds Key [id number]  Status is pending

## 2023-02-15 NOTE — Telephone Encounter (Signed)
Pharmacy Patient Advocate Encounter  Received notification from [facility] that the request for prior authorization for Repatha '140MG'$ /ML has been denied due to .

## 2023-02-15 NOTE — Telephone Encounter (Signed)
Pharmacy Patient Advocate Encounter  Received notification from [facility] that the request for prior authorization for Repatha '140MG'$ /ML has been denied due to .    Please be advised we currently do not have a Pharmacist to review denials, therefore you will need to process appeals accordingly as needed. Thanks for your support at this time.   Please note that this will require an appeal

## 2023-02-17 MED ORDER — EZETIMIBE 10 MG PO TABS: 10.0000 mg | ORAL_TABLET | Freq: Every day | ORAL | 3 refills | Status: DC

## 2023-02-17 NOTE — Addendum Note (Signed)
Addended by: Anda Latina on: 02/17/2023 11:29 AM   Modules accepted: Orders

## 2023-04-23 ENCOUNTER — Other Ambulatory Visit: Payer: Self-pay | Admitting: Cardiovascular Disease

## 2023-05-10 ENCOUNTER — Encounter: Payer: Self-pay | Admitting: Physician Assistant

## 2023-05-10 ENCOUNTER — Encounter: Payer: Self-pay | Admitting: Cardiovascular Disease

## 2023-05-10 DIAGNOSIS — E7800 Pure hypercholesterolemia, unspecified: Secondary | ICD-10-CM

## 2023-05-10 DIAGNOSIS — I2510 Atherosclerotic heart disease of native coronary artery without angina pectoris: Secondary | ICD-10-CM

## 2023-05-10 DIAGNOSIS — R5383 Other fatigue: Secondary | ICD-10-CM

## 2023-05-10 NOTE — Telephone Encounter (Signed)
Pt requesting refill for Alprazolam 0.5 mg 90 day supply. Last OV 11/2022.

## 2023-05-12 NOTE — Telephone Encounter (Signed)
Pt called again and states the pharmacy has not received the RX for  traZODone (DESYREL) 50 MG tablet  Please advise.

## 2023-05-12 NOTE — Telephone Encounter (Signed)
Patient received 90 day supply in January with 1 refill; should be good until July

## 2023-05-17 MED ORDER — ALPRAZOLAM 0.5 MG PO TABS: ORAL_TABLET | ORAL | 0 refills | Status: DC

## 2023-05-17 NOTE — Telephone Encounter (Signed)
Spoke to pt asked her if she needed refill for Alprazolam & Trazodone? Pt said she just needs Alprazolam. Do you want that sent to [facility]? Pt said yes. Told her okay will send message to [facility] and have her send Rx over. Pt verbalized understanding.

## 2023-05-17 NOTE — Telephone Encounter (Signed)
Samantha, pt needs refill for Alprazolam 0.5 mg sent to [facility], 90 day supply. Last OV 12/10/2022.

## 2023-05-18 LAB — CBC
Hematocrit: 37.6 % (ref 34.0–46.6)
Hemoglobin: 12.5 g/dL (ref 11.1–15.9)
MCH: 30.4 pg (ref 26.6–33.0)
MCHC: 33.2 g/dL (ref 31.5–35.7)
MCV: 92 fL (ref 79–97)
Platelets: 261 10*3/uL (ref 150–450)
RBC: 4.11 x10E6/uL (ref 3.77–5.28)
RDW: 12.4 % (ref 11.7–15.4)
WBC: 6.2 10*3/uL (ref 3.4–10.8)

## 2023-05-18 LAB — BASIC METABOLIC PANEL
BUN/Creatinine Ratio: 27 — ABNORMAL HIGH (ref 9–23)
BUN: 19 mg/dL (ref 6–24)
CO2: 27 mmol/L (ref 20–29)
Calcium: 9.8 mg/dL (ref 8.7–10.2)
Chloride: 103 mmol/L (ref 96–106)
Creatinine, Ser: 0.7 mg/dL (ref 0.57–1.00)
Glucose: 85 mg/dL (ref 70–99)
Potassium: 4.6 mmol/L (ref 3.5–5.2)
Sodium: 141 mmol/L (ref 134–144)
eGFR: 100 mL/min/{1.73_m2} (ref >59)

## 2023-05-18 LAB — LIPID PANEL
Chol/HDL Ratio: 2 ratio (ref 0.0–4.4)
Cholesterol, Total: 156 mg/dL (ref 100–199)
HDL: 80 mg/dL (ref >39)
LDL Chol Calc (NIH): 63 mg/dL (ref 0–99)
Triglycerides: 63 mg/dL (ref 0–149)
VLDL Cholesterol Cal: 13 mg/dL (ref 5–40)

## 2023-05-18 LAB — TSH: TSH: 4.15 u[IU]/mL (ref 0.450–4.500)

## 2023-05-18 LAB — HEPATIC FUNCTION PANEL
ALT: 32 IU/L (ref 0–32)
AST: 28 IU/L (ref 0–40)
Albumin: 4.3 g/dL (ref 3.8–4.9)
Alkaline Phosphatase: 70 IU/L (ref 44–121)
Bilirubin Total: 0.4 mg/dL (ref 0.0–1.2)
Bilirubin, Direct: 0.13 mg/dL (ref 0.00–0.40)
Total Protein: 6.8 g/dL (ref 6.0–8.5)

## 2023-09-07 ENCOUNTER — Ambulatory Visit: Payer: 59 | Admitting: Physician Assistant

## 2023-09-29 LAB — HM MAMMOGRAPHY

## 2023-12-07 ENCOUNTER — Encounter: Payer: 59 | Admitting: Physician Assistant

## 2024-01-01 ENCOUNTER — Telehealth: Payer: Self-pay | Admitting: Physician Assistant

## 2024-01-01 DIAGNOSIS — I2510 Atherosclerotic heart disease of native coronary artery without angina pectoris: Secondary | ICD-10-CM

## 2024-01-01 DIAGNOSIS — E7800 Pure hypercholesterolemia, unspecified: Secondary | ICD-10-CM

## 2024-01-01 NOTE — Telephone Encounter (Signed)
 Patient's CPE has been rescheduled to 01/04/24 @ 9:40 am, but pt is wanting to know if CPE labs could be completed prior so results can be discussed. Please advise.

## 2024-01-02 ENCOUNTER — Encounter: Payer: 59 | Admitting: Physician Assistant

## 2024-01-02 NOTE — Telephone Encounter (Signed)
 Please contact pt to schedule lab appt. I have placed orders in Epic.

## 2024-01-04 ENCOUNTER — Ambulatory Visit (INDEPENDENT_AMBULATORY_CARE_PROVIDER_SITE_OTHER): Payer: 59 | Admitting: Physician Assistant

## 2024-01-04 ENCOUNTER — Encounter: Payer: Self-pay | Admitting: Physician Assistant

## 2024-01-04 VITALS — BP 100/78 | HR 66 | Temp 97.0°F | Ht 66.0 in | Wt 143.6 lb

## 2024-01-04 DIAGNOSIS — K921 Melena: Secondary | ICD-10-CM | POA: Diagnosis not present

## 2024-01-04 DIAGNOSIS — Z0000 Encounter for general adult medical examination without abnormal findings: Secondary | ICD-10-CM

## 2024-01-04 DIAGNOSIS — E7800 Pure hypercholesterolemia, unspecified: Secondary | ICD-10-CM

## 2024-01-04 LAB — LIPID PANEL
Cholesterol: 149 mg/dL (ref 0–200)
HDL: 75.3 mg/dL (ref >39.00)
LDL Cholesterol: 65 mg/dL (ref 0–99)
NonHDL: 74.17
Total CHOL/HDL Ratio: 2
Triglycerides: 48 mg/dL (ref 0.0–149.0)
VLDL: 9.6 mg/dL (ref 0.0–40.0)

## 2024-01-04 LAB — CBC WITH DIFFERENTIAL/PLATELET
Basophils Absolute: 0.1 10*3/uL (ref 0.0–0.1)
Basophils Relative: 1.1 % (ref 0.0–3.0)
Eosinophils Absolute: 0.3 10*3/uL (ref 0.0–0.7)
Eosinophils Relative: 5.8 % — ABNORMAL HIGH (ref 0.0–5.0)
HCT: 40 % (ref 36.0–46.0)
Hemoglobin: 13.3 g/dL (ref 12.0–15.0)
Lymphocytes Relative: 28 % (ref 12.0–46.0)
Lymphs Abs: 1.4 10*3/uL (ref 0.7–4.0)
MCHC: 33.2 g/dL (ref 30.0–36.0)
MCV: 92.5 fL (ref 78.0–100.0)
Monocytes Absolute: 0.4 10*3/uL (ref 0.1–1.0)
Monocytes Relative: 8.8 % (ref 3.0–12.0)
Neutro Abs: 2.9 10*3/uL (ref 1.4–7.7)
Neutrophils Relative %: 56.3 % (ref 43.0–77.0)
Platelets: 317 10*3/uL (ref 150.0–400.0)
RBC: 4.33 Mil/uL (ref 3.87–5.11)
RDW: 13.4 % (ref 11.5–15.5)
WBC: 5.1 10*3/uL (ref 4.0–10.5)

## 2024-01-04 LAB — COMPREHENSIVE METABOLIC PANEL
ALT: 29 U/L (ref 0–35)
AST: 28 U/L (ref 0–37)
Albumin: 4.5 g/dL (ref 3.5–5.2)
Alkaline Phosphatase: 54 U/L (ref 39–117)
BUN: 22 mg/dL (ref 6–23)
CO2: 30 meq/L (ref 19–32)
Calcium: 9.6 mg/dL (ref 8.4–10.5)
Chloride: 103 meq/L (ref 96–112)
Creatinine, Ser: 0.73 mg/dL (ref 0.40–1.20)
GFR: 90.04 mL/min (ref >60.00)
Glucose, Bld: 92 mg/dL (ref 70–99)
Potassium: 4.3 meq/L (ref 3.5–5.1)
Sodium: 140 meq/L (ref 135–145)
Total Bilirubin: 0.5 mg/dL (ref 0.2–1.2)
Total Protein: 7.1 g/dL (ref 6.0–8.3)

## 2024-01-04 LAB — TSH: TSH: 3.1 u[IU]/mL (ref 0.35–5.50)

## 2024-01-04 NOTE — Progress Notes (Signed)
Subjective:    Kylie Miller is a 58 y.o. female and is here for a comprehensive physical exam.  HPI  Health Maintenance Due  Topic Date Due   Pneumococcal Vaccine 90-3 Years old (1 of 2 - PCV) Never done   DEXA SCAN  07/02/2021   DTaP/Tdap/Td (2 - Td or Tdap) 03/30/2023   MAMMOGRAM  09/09/2023    Acute Concerns: Blood in stool  She experienced an episode of blood in her stool that occurred in December.  She denied any accompanying pain, needing to strain, abdominal pain, or constipation. She denies experiencing any episodes since the start of the year.  She does report a maternal history of pancreatic cancer. Distant relatives with colon cancer.   Fatigue  She does report experiencing fatigue but attributes that to a recent trip where a lot of walking was involved. She has been experiencing stress due to her father's worsening Alzheimer's.  She is wondering if there's any test that can indicate the chance of  developing the disease.   Chronic Issues: HLD She is compliant with taking crestor 20 mg and Zetia 10 mg daily with no complications. Condition in managed by Dr. Allyson Sabal, Cardiology No complains at this time.  Anxiety  Doing well overall. No symptoms at this time. She does reports compliance and good tolerance of Xanax 0.5 mg which she takes as needed.   Health Maintenance: Immunizations -- N/A Colonoscopy -- Last done on 08-03-22. Normal. Repeat in 10 yrs. Mammogram -- last done on 09-08-22. Normal  PAP -- Last done on 04-01-15. Normal  Bone Density -- last done on 07-03-19. Normal  Diet -- typically a healthy diet, weight watchers.  Exercise -- at least 10k step daily. Resistance training.   Sleep habits -- no complains  Mood -- stable   UTD with dentist? - yes UTD with eye doctor? - yes  Weight history: Wt Readings from Last 10 Encounters:  01/04/24 143 lb 9.6 oz (65.1 kg)  01/18/23 149 lb 9.6 oz (67.9 kg)  11/30/22 154 lb (69.9 kg)  08/03/22 152 lb  12.8 oz (69.3 kg)  07/13/22 152 lb 12.8 oz (69.3 kg)  11/29/21 152 lb (68.9 kg)  11/23/21 154 lb 9.6 oz (70.1 kg)  11/27/20 147 lb (66.7 kg)  06/03/20 142 lb (64.4 kg)  03/24/20 146 lb (66.2 kg)   Body mass index is 23.18 kg/m. No LMP recorded. Patient has had a hysterectomy.  Alcohol use:  reports current alcohol use.  Tobacco use:  Tobacco Use: Low Risk  (01/04/2024)   Patient History    Smoking Tobacco Use: Never    Smokeless Tobacco Use: Never    Passive Exposure: Past   Eligible for lung cancer screening? no     11/30/2022    8:42 AM  Depression screen PHQ 2/9  Decreased Interest 0  Down, Depressed, Hopeless 0  PHQ - 2 Score 0     Other providers/specialists: Patient Care Team: Jarold Motto, [place] as PCP - General (Physician Assistant) Runell Gess, MD as PCP - Cardiology (Cardiology) Harold Hedge, MD as Consulting Physician (Obstetrics and Gynecology)    PMHx, SurgHx, SocialHx, Medications, and Allergies were reviewed in the Visit Navigator and updated as appropriate.   Past Medical History:  Diagnosis Date   Allergic rhinitis    Anxiety    "WHEN GET ON PLANE"   Family history of breast cancer    Family history of colon cancer    Family history of pancreatic cancer  Family history of thyroid cancer    GERD (gastroesophageal reflux disease)    Heart murmur    NO ISSUES WITH IT   Hyperlipidemia    family history   IBS (irritable bowel syndrome)    resolved   Lumbar back pain    Vaginal delivery 1992     Past Surgical History:  Procedure Laterality Date   CESAREAN SECTION  1996   COLONOSCOPY     VAGINAL HYSTERECTOMY       Family History  Problem Relation Age of Onset   Breast cancer Mother 58   Pancreatic cancer Mother 37   Skin cancer Father        Basal cell that required Mohs and radiation   Dementia Father    Thyroid cancer Maternal Grandmother        dx in her 72s   Heart disease Maternal Grandfather    Esophageal  cancer Paternal Grandmother 50   Heart attack Paternal Grandmother 78   Stomach cancer Paternal Grandfather 51   Colon polyps Maternal Aunt    Colon cancer Maternal Aunt        dx between 60-70   Heart disease Paternal Aunt    Colon polyps Paternal Uncle    Colon cancer Paternal Uncle        dx late 60s-70   Colon polyps Paternal Uncle    Colon cancer Paternal Uncle    Cancer Other        PGF's brother with unknown cancer   Cancer Other        MGM's brother with unknown cancer   Crohn's disease Neg Hx    Rectal cancer Neg Hx     Social History   Tobacco Use   Smoking status: Never    Passive exposure: Past (as achild,mother smoked)   Smokeless tobacco: Never  Vaping Use   Vaping status: Never Used  Substance Use Topics   Alcohol use: Yes    Comment: 1 or less daily wine   Drug use: No    Review of Systems:   Review of Systems  Constitutional:  Positive for malaise/fatigue. Negative for chills, fever and weight loss.  HENT:  Negative for hearing loss, sinus pain and sore throat.   Respiratory:  Negative for cough and hemoptysis.   Cardiovascular:  Negative for chest pain, palpitations, leg swelling and PND.  Gastrointestinal:  Positive for blood in stool. Negative for abdominal pain, constipation, diarrhea, heartburn, nausea and vomiting.  Genitourinary:  Negative for dysuria, frequency and urgency.  Musculoskeletal:  Negative for back pain, myalgias and neck pain.  Skin:  Negative for itching and rash.  Neurological:  Negative for dizziness, tingling, seizures and headaches.  Endo/Heme/Allergies:  Negative for polydipsia.  Psychiatric/Behavioral:  Negative for depression. The patient is not nervous/anxious.     Objective:   BP 100/78   Pulse 66   Temp (!) 97 F (36.1 C) (Temporal)   Ht 5\' 6"  (1.676 m)   Wt 143 lb 9.6 oz (65.1 kg)   SpO2 97%   BMI 23.18 kg/m  Body mass index is 23.18 kg/m.   General Appearance:    Alert, cooperative, no distress,  appears stated age  Head:    Normocephalic, without obvious abnormality, atraumatic  Eyes:    PERRL, conjunctiva/corneas clear, EOM's intact, fundi    benign, both eyes  Ears:    Normal TM's and external ear canals, both ears  Nose:   Nares normal, septum midline, mucosa normal, no drainage  or sinus tenderness  Throat:   Lips, mucosa, and tongue normal; teeth and gums normal  Neck:   Supple, symmetrical, trachea midline, no adenopathy;    thyroid:  no enlargement/tenderness/nodules; no carotid   bruit or JVD  Back:     Symmetric, no curvature, ROM normal, no CVA tenderness  Lungs:     Clear to auscultation bilaterally, respirations unlabored  Chest Wall:    No tenderness or deformity   Heart:    Regular rate and rhythm, S1 and S2 normal, no murmur, rub or gallop  Breast Exam:    Deferred  Abdomen:     Soft, non-tender, bowel sounds active all four quadrants,    no masses, no organomegaly  Genitalia:    Deferred  Extremities:   Extremities normal, atraumatic, no cyanosis or edema  Pulses:   2+ and symmetric all extremities  Skin:   Skin color, texture, turgor normal, no rashes or lesions  Lymph nodes:   Cervical, supraclavicular, and axillary nodes normal  Neurologic:   CNII-XII intact, normal strength, sensation and reflexes    throughout    Assessment/Plan:   Encounter for annual physical exam Today patient counseled on age appropriate routine health concerns for screening and prevention, each reviewed and up to date or declined. Immunizations reviewed and up to date or declined. Labs ordered and reviewed. Risk factors for depression reviewed and negative. Hearing function and visual acuity are intact. ADLs screened and addressed as needed. Functional ability and level of safety reviewed and appropriate. Education, counseling and referrals performed based on assessed risks today. Patient provided with a copy of personalized plan for preventive services.  Pure  hypercholesterolemia Update lipid panel and will have her continue to be managed by Dr Allyson Sabal  Blood in stool No further episodes Luckily she is UpToDate with colonoscopy Recommend ifob and update blood work panel If ongoing or positive ifob, will refer to gastroenterology   Jarold Motto, PA-C [place] [facility] M Kad[facility],acting as a Neurosurgeon for [employer], PA.,have documented all relevant documentation on the behalf of Jarold Motto, PA,as directed by  Jarold Motto, PA while in the presence of Jarold Motto, [place].   I, Jarold Motto, [place], have reviewed all documentation for this visit. The documentation on 01/04/24 for the exam, diagnosis, procedures, and orders are all accurate and complete.

## 2024-01-04 NOTE — Patient Instructions (Signed)
It was great to see you!  Please go to the lab for blood work.   Our office will call you with your results unless you have chosen to receive results via MyChart.  If your blood work is normal we will follow-up each year for physicals and as scheduled for chronic medical problems.  If anything is abnormal we will treat accordingly and get you in for a follow-up.  Take care,  [facility] Readings from Last 3 Encounters:  01/04/24 143 lb 9.6 oz (65.1 kg)  01/18/23 149 lb 9.6 oz (67.9 kg)  11/30/22 154 lb (69.9 kg)

## 2024-01-05 ENCOUNTER — Other Ambulatory Visit: Payer: 59

## 2024-01-08 ENCOUNTER — Other Ambulatory Visit (INDEPENDENT_AMBULATORY_CARE_PROVIDER_SITE_OTHER): Payer: 59

## 2024-01-08 DIAGNOSIS — K921 Melena: Secondary | ICD-10-CM

## 2024-01-09 ENCOUNTER — Telehealth: Payer: Self-pay

## 2024-01-09 ENCOUNTER — Encounter: Payer: Self-pay | Admitting: Physician Assistant

## 2024-01-09 ENCOUNTER — Other Ambulatory Visit: Payer: Self-pay | Admitting: Physician Assistant

## 2024-01-09 DIAGNOSIS — R195 Other fecal abnormalities: Secondary | ICD-10-CM

## 2024-01-09 LAB — FECAL OCCULT BLOOD, IMMUNOCHEMICAL: Fecal Occult Bld: POSITIVE — AB

## 2024-01-09 NOTE — Telephone Encounter (Signed)
Noted  

## 2024-01-09 NOTE — Telephone Encounter (Signed)
Lori from [employer] called with a critical positive IFOB on this pt. Sam was in a room when I walked over to verbally give the results to her so I spoke with Lupita Leash and made her aware of the results.

## 2024-01-18 ENCOUNTER — Ambulatory Visit: Payer: 59 | Admitting: Internal Medicine

## 2024-01-18 ENCOUNTER — Encounter: Payer: Self-pay | Admitting: Internal Medicine

## 2024-01-18 ENCOUNTER — Other Ambulatory Visit (INDEPENDENT_AMBULATORY_CARE_PROVIDER_SITE_OTHER): Payer: 59

## 2024-01-18 VITALS — BP 100/70 | HR 74 | Ht 66.0 in | Wt 144.0 lb

## 2024-01-18 DIAGNOSIS — R195 Other fecal abnormalities: Secondary | ICD-10-CM

## 2024-01-18 DIAGNOSIS — K625 Hemorrhage of anus and rectum: Secondary | ICD-10-CM

## 2024-01-18 LAB — CBC WITH DIFFERENTIAL/PLATELET
Basophils Absolute: 0.1 10*3/uL (ref 0.0–0.1)
Basophils Relative: 0.8 % (ref 0.0–3.0)
Eosinophils Absolute: 0.2 10*3/uL (ref 0.0–0.7)
Eosinophils Relative: 3.8 % (ref 0.0–5.0)
HCT: 38.6 % (ref 36.0–46.0)
Hemoglobin: 13 g/dL (ref 12.0–15.0)
Lymphocytes Relative: 30 % (ref 12.0–46.0)
Lymphs Abs: 1.9 10*3/uL (ref 0.7–4.0)
MCHC: 33.7 g/dL (ref 30.0–36.0)
MCV: 91.2 fL (ref 78.0–100.0)
Monocytes Absolute: 0.5 10*3/uL (ref 0.1–1.0)
Monocytes Relative: 7.5 % (ref 3.0–12.0)
Neutro Abs: 3.7 10*3/uL (ref 1.4–7.7)
Neutrophils Relative %: 57.9 % (ref 43.0–77.0)
Platelets: 292 10*3/uL (ref 150.0–400.0)
RBC: 4.24 Mil/uL (ref 3.87–5.11)
RDW: 12.8 % (ref 11.5–15.5)
WBC: 6.4 10*3/uL (ref 4.0–10.5)

## 2024-01-18 LAB — IBC + FERRITIN
Ferritin: 73.7 ng/mL (ref 10.0–291.0)
Iron: 153 ug/dL — ABNORMAL HIGH (ref 42–145)
Saturation Ratios: 41.7 % (ref 20.0–50.0)
TIBC: 366.8 ug/dL (ref 250.0–450.0)
Transferrin: 262 mg/dL (ref 212.0–360.0)

## 2024-01-18 NOTE — Patient Instructions (Signed)
Your provider has requested that you go to the basement level for lab work before leaving today. Press "B" on the elevator. The lab is located at the first door on the left as you exit the elevator.  Follow the instructions on the Hemoccult cards and mail them back to [place] when you are finished or you may take them directly to the lab in the basement of the [place] building. We will call you with the results.    _______________________________________________________  If your blood pressure at your visit was 140/90 or greater, please contact your primary care physician to follow up on this.  _______________________________________________________  If you are age 38 or older, your body mass index should be between 23-30. Your Body mass index is 23.24 kg/m. If this is out of the aforementioned range listed, please consider follow up with your Primary Care Provider.  If you are age 56 or younger, your body mass index should be between 19-25. Your Body mass index is 23.24 kg/m. If this is out of the aformentioned range listed, please consider follow up with your Primary Care Provider.   ________________________________________________________  The [place] [facility] providers would like to encourage you to use [facility] to communicate with providers for non-urgent requests or questions.  Due to long hold times on the telephone, sending your provider a message by [facility] may be a faster and more efficient way to get a response.  Please allow 48 business hours for a response.  Please remember that this is for non-urgent requests.  _______________________________________________________

## 2024-01-18 NOTE — Progress Notes (Signed)
Patient ID: Kylie Miller, female   DOB: Apr 25, 1966, 58 y.o.   MRN: [id number] HPI: Kylie Miller is a 58 year old female known to me from her colon cancer screening with a history of hyperlipidemia who is here to evaluate isolated red blood per rectum and a positive fit test.  She is here alone today.  I last saw her for her screening colonoscopy, her second such exam 10 years apart.  Colonoscopy on 08/03/2022 was normal with a good prep and a complete exam.  Rectal bleeding began around December 11th while she was abroad, characterized by bright red blood on tissue and occasionally as drops in the toilet. These episodes occurred three to four times, with the last episode during the Christmas period. The bleeding is painless and not associated with straining during bowel movements. No melena is reported.  She has a history of mild intermittent constipation, with bowel movements sometimes requiring effort. Recently, stools have been narrower and lighter in color, though no visible blood was observed in the stool sample submitted for testing.  She experienced gastrointestinal symptoms consistent with a recent norovirus infection, including diarrhea and vomiting, affecting her and her family. She plans to take probiotics to restore gut flora post-infection.  Past Medical History:  Diagnosis Date   Allergic rhinitis    Anxiety    "WHEN GET ON PLANE"   Family history of breast cancer    Family history of colon cancer    Family history of pancreatic cancer    Family history of thyroid cancer    GERD (gastroesophageal reflux disease)    Heart murmur    NO ISSUES WITH IT   Hyperlipidemia    family history   IBS (irritable bowel syndrome)    resolved   Lumbar back pain    Vaginal delivery 1992    Past Surgical History:  Procedure Laterality Date   CESAREAN SECTION  1996   COLONOSCOPY     VAGINAL HYSTERECTOMY      Outpatient Medications Prior to Visit  Medication Sig Dispense Refill    ALPRAZolam (XANAX) 0.5 MG tablet TAKE 1 TABLET BY MOUTH AT  BEDTIME AS NEEDED FOR  ANXIETY 90 tablet 0   calcium carbonate (OS-CAL) 1250 (500 Ca) MG chewable tablet Chew 1 tablet by mouth daily.     Cholecalciferol (VITAMIN D3) 50 MCG (2000 UT) TABS Take by mouth daily.     ezetimibe (ZETIA) 10 MG tablet Take 1 tablet (10 mg total) by mouth daily. 90 tablet 3   folic acid (FOLVITE) 400 MCG tablet Take 400 mcg by mouth daily.     glucosamine-chondroitin 500-400 MG tablet Take 1 tablet by mouth at bedtime. 2000 mg     Multiple Vitamins-Minerals (MULTIVITAMIN WITH MINERALS) tablet Take 1 tablet by mouth daily.     rosuvastatin (CRESTOR) 40 MG tablet Take 1 tablet by mouth once daily 90 tablet 2   No facility-administered medications prior to visit.    Allergies  Allergen Reactions   Codeine     REACTION: nausea    Family History  Problem Relation Age of Onset   Breast cancer Mother 53   Pancreatic cancer Mother 62   Skin cancer Father        Basal cell that required Mohs and radiation   Dementia Father    Thyroid cancer Maternal Grandmother        dx in her 9s   Heart disease Maternal Grandfather    Esophageal cancer Paternal Grandmother 37   Heart  attack Paternal Grandmother 39   Stomach cancer Paternal Grandfather 3   Colon polyps Maternal Aunt    Colon cancer Maternal Aunt        dx between 53-70   Heart disease Paternal Aunt    Colon polyps Paternal Uncle    Colon cancer Paternal Uncle        dx late 60s-70   Colon polyps Paternal Uncle    Colon cancer Paternal Uncle    Cancer Other        PGF's brother with unknown cancer   Cancer Other        MGM's brother with unknown cancer   Crohn's disease Neg Hx    Rectal cancer Neg Hx     Social History   Tobacco Use   Smoking status: Never    Passive exposure: Past (as achild,mother smoked)   Smokeless tobacco: Never  Vaping Use   Vaping status: Never Used  Substance Use Topics   Alcohol use: Yes    Comment: 1 or  less daily wine   Drug use: No    ROS: As per history of present illness, otherwise negative  BP 100/70   Pulse 74   Ht 5\' 6"  (1.676 m)   Wt 144 lb (65.3 kg)   BMI 23.24 kg/m  Gen: awake, alert, NAD HEENT: anicteric  Ext: no c/c/e Neuro: nonfocal   RELEVANT LABS AND IMAGING: CBC    Component Value Date/Time   WBC 5.1 01/04/2024 1032   RBC 4.33 01/04/2024 1032   HGB 13.3 01/04/2024 1032   HGB 12.5 05/17/2023 0836   HCT 40.0 01/04/2024 1032   HCT 37.6 05/17/2023 0836   PLT 317.0 01/04/2024 1032   PLT 261 05/17/2023 0836   MCV 92.5 01/04/2024 1032   MCV 92 05/17/2023 0836   MCH 30.4 05/17/2023 0836   MCH 31.0 11/27/2020 0933   MCHC 33.2 01/04/2024 1032   RDW 13.4 01/04/2024 1032   RDW 12.4 05/17/2023 0836   LYMPHSABS 1.4 01/04/2024 1032   MONOABS 0.4 01/04/2024 1032   EOSABS 0.3 01/04/2024 1032   BASOSABS 0.1 01/04/2024 1032    CMP     Component Value Date/Time   NA 140 01/04/2024 1032   NA 141 05/17/2023 0836   K 4.3 01/04/2024 1032   CL 103 01/04/2024 1032   CO2 30 01/04/2024 1032   GLUCOSE 92 01/04/2024 1032   GLUCOSE 89 01/01/2007 0904   BUN 22 01/04/2024 1032   BUN 19 05/17/2023 0836   CREATININE 0.73 01/04/2024 1032   CREATININE 0.64 11/27/2020 0933   CALCIUM 9.6 01/04/2024 1032   PROT 7.1 01/04/2024 1032   PROT 6.8 05/17/2023 0836   ALBUMIN 4.5 01/04/2024 1032   ALBUMIN 4.3 05/17/2023 0836   AST 28 01/04/2024 1032   ALT 29 01/04/2024 1032   ALKPHOS 54 01/04/2024 1032   BILITOT 0.5 01/04/2024 1032   BILITOT 0.4 05/17/2023 0836   GFRNONAA 114.82 10/09/2009 1016   GFRAA 117 04/16/2008 0905   FIT +  ASSESSMENT/PLAN: 58 year old female known to me from her colon cancer screening with a history of hyperlipidemia who is here to evaluate isolated red blood per rectum and a positive fit test.  Isolated Rectal Bleeding/+ FIT Patient reported several instances of bright red blood on wiping, with no associated pain. No visible blood on stool sample  submitted for FIT test, which returned positive. Patient has a history of two normal colonoscopies and no visible hemorrhoids. Possible internal hemorrhoid or other source of  bleeding.  I am reassured by her recent colonoscopy and colonoscopy normal 10 years prior to that.  However, we have an abnormal test and will follow-up as follows: -Order CBC, ferritin, and IBC to assess for chronic blood loss. -Repeat fecal occult blood test using three different stool samples. -If tests are normal and no further bleeding, observe. If tests are positive or patient reports further bleeding, proceed with colonoscopy.  If iron deficient EGD recommended as well.  Post-Infectious Gastrointestinal Disturbance Patient reported recent norovirus infection with ongoing symptoms of diarrhea and nausea. -Recommend trial of Florastor probiotic 14 to 21 days/box instruction  30 minutes total spent today including patient facing time, coordination of care, reviewing medical history/procedures/pertinent radiology studies, and documentation of the encounter.  ZO:XWRUEA, [place], [place] [address] [place],  [place] [address]

## 2024-01-22 ENCOUNTER — Encounter: Payer: Self-pay | Admitting: Cardiovascular Disease

## 2024-01-22 ENCOUNTER — Ambulatory Visit: Payer: 59 | Attending: Cardiovascular Disease | Admitting: Cardiovascular Disease

## 2024-01-22 VITALS — BP 108/66 | HR 64 | Ht 66.0 in | Wt 143.0 lb

## 2024-01-22 DIAGNOSIS — I2510 Atherosclerotic heart disease of native coronary artery without angina pectoris: Secondary | ICD-10-CM

## 2024-01-22 DIAGNOSIS — I2583 Coronary atherosclerosis due to lipid rich plaque: Secondary | ICD-10-CM | POA: Diagnosis not present

## 2024-01-22 DIAGNOSIS — R002 Palpitations: Secondary | ICD-10-CM | POA: Diagnosis not present

## 2024-01-22 DIAGNOSIS — E7800 Pure hypercholesterolemia, unspecified: Secondary | ICD-10-CM

## 2024-01-22 MED ORDER — EZETIMIBE 10 MG PO TABS: 10.0000 mg | ORAL_TABLET | Freq: Every day | ORAL | 3 refills | Status: AC

## 2024-01-22 MED ORDER — ROSUVASTATIN CALCIUM 40 MG PO TABS: 40.0000 mg | ORAL_TABLET | Freq: Every day | ORAL | 3 refills | Status: AC

## 2024-01-22 NOTE — Patient Instructions (Signed)

## 2024-01-22 NOTE — Assessment & Plan Note (Signed)
History of elevated coronary calcium score of 113 calcium in the LAD and RCA.  She is active and asymptomatic.  She is at goal for secondary prevention with regards to her lipid profile.

## 2024-01-22 NOTE — Assessment & Plan Note (Signed)
History of hyperlipidemia on rosuvastatin and ezetimibe with lipid profile performed 01/04/2024 revealing total cholesterol 149, LDL 65 HDL 75.

## 2024-01-22 NOTE — Assessment & Plan Note (Signed)
No longer an issue since discontinuing caffeine.

## 2024-01-22 NOTE — Progress Notes (Signed)
01/22/2024 Kylie Miller   [birth year]  [id number]  Primary Physician Jarold Motto, PA Primary Cardiologist: Runell Gess MD Nicholes [place], [place]  HPI:  Kylie Miller is a 58 y.o.  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  She was referred by Jarold Motto, PA-C for elevated coronary calcium score.  I last saw her in the office 01/18/2023. Her only risk factor for CAD is hyperlipidemia on statin therapy.  She does not smoke.  She drinks wine daily.  There is no family history for heart disease.  She is never had a heart tach or stroke.  She denies chest pain or shortness of breath.  Recent coronary calcium score performed 12/05/2019 was 113.  Her most recent LDL on simvastatin 20 was 409 on 11/20/2019.  She complains of occasional "flutters "" in her chest although she does drink over 4 cups of caffeinated beverages a day.   She decreased her caffeine intake at my request her palpitations have resolved.  She did change her simvastatin to rosuvastatin because of elevated LDL in the setting of moderately elevated coronary calcium score which resulted in marked improvement in her lipid profile.  This was performed 03/03/2020 revealing a total cholesterol of 179, LDL of 82 and HDL of 84.   Since I saw her a year ago she is remained stable.  She still active, walks over 10,000 steps a day and does aerobics.  Her PCP added ezetimibe to her lipid profile which resulted in significant improvement with a total cholesterol 149, LDL 65 and HDL of 75.  She is totally asymptomatic..   Current Meds  Medication Sig   ALPRAZolam (XANAX) 0.5 MG tablet TAKE 1 TABLET BY MOUTH AT  BEDTIME AS NEEDED FOR  ANXIETY   calcium carbonate (OS-CAL) 1250 (500 Ca) MG chewable tablet Chew 1 tablet by mouth daily.   Cholecalciferol (VITAMIN  D3) 50 MCG (2000 UT) TABS Take by mouth daily.   folic acid (FOLVITE) 400 MCG tablet Take 400 mcg by mouth daily.   glucosamine-chondroitin 500-400 MG tablet Take 1 tablet by mouth at bedtime. 2000 mg   Multiple Vitamins-Minerals (MULTIVITAMIN WITH MINERALS) tablet Take 1 tablet by mouth daily.   [DISCONTINUED] ezetimibe (ZETIA) 10 MG tablet Take 1 tablet (10 mg total) by mouth daily.   [DISCONTINUED] rosuvastatin (CRESTOR) 40 MG tablet Take 1 tablet by mouth once daily     Allergies  Allergen Reactions   Codeine     REACTION: nausea    Social History   Socioeconomic History   Marital status: Married    Spouse name: david    Number of children: 2   Years of education: Not on file   Highest education level: Not on file  Occupational History   Occupation: Conservation officer, historic buildings: [employer]  Tobacco Use   Smoking status: Never    Passive exposure: Past (as achild,mother smoked)   Smokeless tobacco: Never  Vaping Use   Vaping status: Never Used  Substance and Sexual Activity   Alcohol use: Yes    Comment: 1 or less daily wine   Drug use: No   Sexual activity: Not on file  Other Topics Concern   Not on file  Social History Narrative   Not on file   Social Drivers of Health  Financial Resource Strain: Not on file  Food Insecurity: Not on file  Transportation Needs: Not on file  Physical Activity: Not on file  Stress: Not on file  Social Connections: Not on file  Intimate Partner Violence: Not on file     Review of Systems: General: negative for chills, fever, night sweats or weight changes.  Cardiovascular: negative for chest pain, dyspnea on exertion, edema, orthopnea, palpitations, paroxysmal nocturnal dyspnea or shortness of breath Dermatological: negative for rash Respiratory: negative for cough or wheezing Urologic: negative for hematuria Abdominal: negative for nausea, vomiting, diarrhea, bright red blood per rectum, melena, or  hematemesis Neurologic: negative for visual changes, syncope, or dizziness All other systems reviewed and are otherwise negative except as noted above.    Blood pressure 108/66, pulse 64, height 5\' 6"  (1.676 m), weight 143 lb (64.9 kg), SpO2 99%.  General appearance: alert and no distress Neck: no adenopathy, no carotid bruit, no JVD, supple, symmetrical, trachea midline, and thyroid not enlarged, symmetric, no tenderness/mass/nodules Lungs: clear to auscultation bilaterally Heart: regular rate and rhythm, S1, S2 normal, no murmur, click, rub or gallop Extremities: extremities normal, atraumatic, no cyanosis or edema Pulses: 2+ and symmetric Skin: Skin color, texture, turgor normal. No rashes or lesions Neurologic: Grossly normal  EKG EKG Interpretation Date/Time:  Monday January 22 2024 09:31:11 EST Ventricular Rate:  64 PR Interval:  194 QRS Duration:  80 QT Interval:  408 QTC Calculation: 420 R Axis:   27  Text Interpretation: Normal sinus rhythm Normal ECG No previous ECGs available Confirmed by Nanetta Batty [phone]) on 01/22/2024 9:42:52 AM    ASSESSMENT AND PLAN:   HYPERCHOLESTEROLEMIA History of hyperlipidemia on rosuvastatin and ezetimibe with lipid profile performed 01/04/2024 revealing total cholesterol 149, LDL 65 HDL 75.  CAD (coronary artery disease) History of elevated coronary calcium score of 113 calcium in the LAD and RCA.  She is active and asymptomatic.  She is at goal for secondary prevention with regards to her lipid profile.  Palpitations No longer an issue since discontinuing caffeine.     Runell Gess MD FACP,FACC,FAHA, [facility] 01/22/2024 9:50 AM

## 2024-01-23 ENCOUNTER — Ambulatory Visit (INDEPENDENT_AMBULATORY_CARE_PROVIDER_SITE_OTHER): Payer: 59

## 2024-01-23 DIAGNOSIS — K625 Hemorrhage of anus and rectum: Secondary | ICD-10-CM | POA: Diagnosis not present

## 2024-01-23 DIAGNOSIS — R195 Other fecal abnormalities: Secondary | ICD-10-CM

## 2024-01-23 LAB — HEMOCCULT SLIDES (X 3 CARDS)
Fecal Occult Blood: NEGATIVE
OCCULT 1: NEGATIVE
OCCULT 2: NEGATIVE
OCCULT 3: NEGATIVE
OCCULT 4: NEGATIVE
OCCULT 5: NEGATIVE

## 2024-01-25 ENCOUNTER — Encounter: Payer: Self-pay | Admitting: Internal Medicine

## 2024-03-13 ENCOUNTER — Encounter: Payer: Self-pay | Admitting: Physician Assistant

## 2024-03-25 ENCOUNTER — Ambulatory Visit: Payer: Self-pay

## 2024-03-25 ENCOUNTER — Other Ambulatory Visit

## 2024-03-25 DIAGNOSIS — R300 Dysuria: Secondary | ICD-10-CM

## 2024-03-25 NOTE — Telephone Encounter (Signed)
 Please see message and advise

## 2024-03-25 NOTE — Addendum Note (Signed)
 Addended by: Jimmye Norman on: 03/25/2024 11:37 AM   Modules accepted: Orders

## 2024-03-25 NOTE — Telephone Encounter (Signed)
Please call pt and schedule lab appt. Orders are in [place]. ?

## 2024-03-25 NOTE — Telephone Encounter (Signed)
 Babs Bertin, RN  [facility]25 minutes ago (10:04 AM)    Mindi Slicker triage-patient is asking if she can give a urine sample today in the office between 1 and 3-she is wanting a lab appointment. Patient is asking for an order to be placed. Patient is scheduled for an acute visit for tomorrow at 11:00 AM. Would like a mychart message about being able to give urine sample.

## 2024-03-25 NOTE — Addendum Note (Signed)
 Addended by: Sumner Boast on: 03/25/2024 02:37 PM   Modules accepted: Orders

## 2024-03-25 NOTE — Telephone Encounter (Signed)
  Chief Complaint: bad smelling urine Symptoms: red bumps to vaginal area, bad smelling urine, dark urine and takes a minute to start urine flow. Frequency: since last week Pertinent Negatives: Patient denies fever, CP, SOB Disposition: [] ED /[] Urgent Care (no appt availability in office) / [x] Appointment(In office/virtual)/ []  [facility]/ [] Home Care/ [] Refused Recommended Disposition /[] [facility]/ []  Follow-up with PCP Additional Notes: patient called with concerns for possible UTI. Patient reports bad smelling urine, red bumps to vaginal area, dark urine and feeling like urine flow takes a minute to start. Per protocol, patient is recommended to be seen in 24 hours. Appointment made for tomorrow, 03/26/2024 at 11:00 AM. Patient is requesting to be able to give a urine sample today in the office between 1:00 and 3:00 PM. Patient was told that an order would need to be placed for patient to be able to do that. Patient is wanting a message on MyChart about a lab appointment. Patient verbalized understanding of plan and all questions answered.    Copied from CRM [id number]. Topic: Clinical - Red Word Triage >> Mar 25, 2024  9:47 AM Emylou G wrote: [facility] that prompted transfer to Nurse Triage: UTI? Marland Kitchen. red bumps in the area, urine dark, urine smell, hard to pee and it hurts Reason for Disposition  Bad or foul-smelling urine  Answer Assessment - Initial Assessment Questions 1. SYMPTOM: "What's the main symptom you're concerned about?" (e.g., frequency, incontinence)     Urine dark, bad smelling urine 2. ONSET: "When did the  dark urine, bad smelling urine  start?"     Middle of last week 3. PAIN: "Is there any pain?" If Yes, ask: "How bad is it?" (Scale: 1-10; mild, moderate, severe)     moderate 4. CAUSE: "What do you think is causing the symptoms?"     Possible UTI 5. OTHER SYMPTOMS: "Do you have any other symptoms?" (e.g., blood in urine, fever, flank pain, pain with  urination)     Red bumps to vagina area  Protocols used: Urinary Symptoms-A-AH

## 2024-03-26 ENCOUNTER — Other Ambulatory Visit (HOSPITAL_COMMUNITY)
Admission: RE | Admit: 2024-03-26 | Discharge: 2024-03-26 | Disposition: A | Discharge status: Home / Self Care | Source: Ambulatory Visit | Attending: Physician Assistant | Admitting: Physician Assistant

## 2024-03-26 ENCOUNTER — Encounter: Payer: Self-pay | Admitting: Physician Assistant

## 2024-03-26 ENCOUNTER — Ambulatory Visit: Admitting: Physician Assistant

## 2024-03-26 VITALS — BP 100/70 | HR 64 | Temp 97.5°F | Ht 66.0 in | Wt 151.2 lb

## 2024-03-26 DIAGNOSIS — R300 Dysuria: Secondary | ICD-10-CM

## 2024-03-26 DIAGNOSIS — N9089 Other specified noninflammatory disorders of vulva and perineum: Secondary | ICD-10-CM

## 2024-03-26 DIAGNOSIS — N898 Other specified noninflammatory disorders of vagina: Secondary | ICD-10-CM | POA: Diagnosis not present

## 2024-03-26 LAB — URINE CULTURE
MICRO NUMBER:: 16297325
SPECIMEN QUALITY:: ADEQUATE

## 2024-03-26 MED ORDER — CEPHALEXIN 500 MG PO CAPS: 500.0000 mg | ORAL_CAPSULE | Freq: Two times a day (BID) | ORAL | 0 refills | Status: DC

## 2024-03-26 NOTE — Patient Instructions (Signed)
 It was great to see you!  Use barrier/diaper cream such as desitin with zinc oxide on your vulva -- if does not improve -- please schedule with gynecology  Please ask your pharmacist about the MMR vaccine and cost?  Start keflex antibiotic(s) for your possible urinary tract infection (UTI) -- we will be in touch with results  General instructions Make sure you: Pee until your bladder is empty. Do not hold pee for a long time. Empty your bladder after sex. Wipe from front to back after pooping if you are a female. Use each tissue one time when you wipe. Drink enough fluid to keep your pee pale yellow. Keep all follow-up visits as told by your doctor. This is important. Contact a doctor if: You do not get better after 1-2 days. Your symptoms go away and then come back. Get help right away if: You have very bad back pain. You have very bad pain in your lower belly. You have a fever. You are sick to your stomach (nauseous). You are throwing up.   Take care,  Jarold Motto PA-C

## 2024-03-26 NOTE — Progress Notes (Signed)
 Kylie Miller is a 58 y.o. female here for a new problem.  History of Present Illness:   Chief Complaint  Patient presents with   Dysuria    Pt c/o burning with urination, started last Friday. Denies fever or chills, no back pain. Has been drinking cranberry juice and water. Also c/o red bumps at open of vagina noticed on Sunday.   Dysuria // Vaginal Discharge // Vaginal Lesions Patient complains of today of dysuria that has persisted since this past Friday.  Associated symptoms include urinary pressure and foul urinary smell. She states that it feels like her body will wait till her bladder is completley full in order for her to urinate.   She also reports mild vaginal discharge last night and red bumps on her vulvar region which she first noticed on Sunday.  Reports wearing liners daily but this has been her usual for the past few years.  Patient states that she had norovirus about 2 weeks ago causing her diet to be high in carbs, then she started to notice her current symptoms   Reports drinking water and cranberry juice to help relief symptoms.  Denies any fever, chills, or back pain.   Past Medical History:  Diagnosis Date   Allergic rhinitis    Anxiety    "WHEN GET ON PLANE"   Family history of breast cancer    Family history of colon cancer    Family history of pancreatic cancer    Family history of thyroid cancer    GERD (gastroesophageal reflux disease)    Heart murmur    NO ISSUES WITH IT   Hyperlipidemia    family history   IBS (irritable bowel syndrome)    resolved   Lumbar back pain    Vaginal delivery 1992     Social History   Tobacco Use   Smoking status: Never    Passive exposure: Past (as achild,mother smoked)   Smokeless tobacco: Never  Vaping Use   Vaping status: Never Used  Substance Use Topics   Alcohol use: Yes    Comment: 1 or less daily wine   Drug use: No    Past Surgical History:  Procedure Laterality Date   CESAREAN SECTION  1996    COLONOSCOPY     VAGINAL HYSTERECTOMY      Family History  Problem Relation Age of Onset   Breast cancer Mother 34   Pancreatic cancer Mother 12   Skin cancer Father        Basal cell that required Mohs and radiation   Dementia Father    Thyroid cancer Maternal Grandmother        dx in her 37s   Heart disease Maternal Grandfather    Esophageal cancer Paternal Grandmother 71   Heart attack Paternal Grandmother 71   Stomach cancer Paternal Grandfather 6   Colon polyps Maternal Aunt    Colon cancer Maternal Aunt        dx between 15-70   Heart disease Paternal Aunt    Colon polyps Paternal Uncle    Colon cancer Paternal Uncle        dx late 60s-70   Colon polyps Paternal Uncle    Colon cancer Paternal Uncle    Cancer Other        PGF's brother with unknown cancer   Cancer Other        MGM's brother with unknown cancer   Crohn's disease Neg Hx    Rectal cancer Neg Hx  Allergies  Allergen Reactions   Codeine     REACTION: nausea    Current Medications:   Current Outpatient Medications:    ALPRAZolam (XANAX) 0.5 MG tablet, TAKE 1 TABLET BY MOUTH AT  BEDTIME AS NEEDED FOR  ANXIETY, Disp: 90 tablet, Rfl: 0   calcium carbonate (OS-CAL) 1250 (500 Ca) MG chewable tablet, Chew 1 tablet by mouth daily., Disp: , Rfl:    Cholecalciferol (VITAMIN D3) 50 MCG (2000 UT) TABS, Take by mouth daily., Disp: , Rfl:    ezetimibe (ZETIA) 10 MG tablet, Take 1 tablet (10 mg total) by mouth daily., Disp: 90 tablet, Rfl: 3   folic acid (FOLVITE) 400 MCG tablet, Take 400 mcg by mouth daily., Disp: , Rfl:    glucosamine-chondroitin 500-400 MG tablet, Take 1 tablet by mouth at bedtime. 2000 mg, Disp: , Rfl:    Multiple Vitamins-Minerals (MULTIVITAMIN WITH MINERALS) tablet, Take 1 tablet by mouth daily., Disp: , Rfl:    rosuvastatin (CRESTOR) 40 MG tablet, Take 1 tablet (40 mg total) by mouth daily., Disp: 90 tablet, Rfl: 3   Review of Systems:   Review of Systems  Constitutional:  Negative  for chills and fever.  Genitourinary:  Positive for dysuria.       +foul smell +vaginal discharge  Musculoskeletal:  Negative for back pain.  Negative unless otherwise specified per HPI.  Vitals:   Vitals:   03/26/24 1105  BP: 100/70  Pulse: 64  Temp: (!) 97.5 F (36.4 C)  TempSrc: Temporal  SpO2: 98%  Weight: 151 lb 4 oz (68.6 kg)  Height: 5\' 6"  (1.676 m)     Body mass index is 24.41 kg/m.  Physical Exam:   Physical Exam Exam conducted with a chaperone present.  Constitutional:      Appearance: Normal appearance. She is well-developed.  HENT:     Head: Normocephalic and atraumatic.  Eyes:     General: Lids are normal.     Extraocular Movements: Extraocular movements intact.     Conjunctiva/sclera: Conjunctivae normal.  Pulmonary:     Effort: Pulmonary effort is normal.  Genitourinary:    Comments: Scattered erythematous lesions vs skin breakdown to left vulva without tenderness to palpation or discharge  Musculoskeletal:        General: Normal range of motion.     Cervical back: Normal range of motion and neck supple.  Skin:    General: Skin is warm and dry.  Neurological:     Mental Status: She is alert and oriented to person, place, and time.  Psychiatric:        Attention and Perception: Attention and perception normal.        Mood and Affect: Mood normal.        Behavior: Behavior normal.        Thought Content: Thought content normal.        Judgment: Judgment normal.     Assessment and Plan:   Dysuria No red flags Will empirically treat w keflex per orders Culture pending Worsening precautions advised Push fluids Follow up if worsening   Vaginal discharge Unclear etiology Cervical swab obtained Declined sexual transmitted infection testing If no explanation and symptom(s) persists -- recommend close follow up with gynecology   Vulvar lesion Unclear etiology Possible contact irritation with pads? Recommend topical barrier cream such as  desitin with zinc oxide Follow up with gynecology if no improvement or worsening    Jarold Motto, PA-C  I,Safa M Kadhim,acting as a Neurosurgeon for [employer]  Bufford Buttner, PA.,have documented all relevant documentation on the behalf of Jarold Motto, PA,as directed by  Jarold Motto, PA while in the presence of Jarold Motto, [place].   I, Jarold Motto, [place], have reviewed all documentation for this visit. The documentation on 03/26/24 for the exam, diagnosis, procedures, and orders are all accurate and complete.

## 2024-03-27 ENCOUNTER — Encounter: Payer: Self-pay | Admitting: Physician Assistant

## 2024-03-27 LAB — CERVICOVAGINAL ANCILLARY ONLY
Bacterial Vaginitis (gardnerella): NEGATIVE
Candida Glabrata: NEGATIVE
Candida Vaginitis: NEGATIVE
Comment: NEGATIVE
Comment: NEGATIVE
Comment: NEGATIVE

## 2024-03-28 ENCOUNTER — Encounter: Payer: Self-pay | Admitting: Physician Assistant

## 2024-04-01 ENCOUNTER — Other Ambulatory Visit: Payer: Self-pay

## 2024-09-30 LAB — HM MAMMOGRAPHY

## 2024-09-30 LAB — HM DEXA SCAN

## 2024-12-25 ENCOUNTER — Encounter: Payer: Self-pay | Admitting: Cardiovascular Disease

## 2025-01-06 ENCOUNTER — Encounter: Payer: Self-pay | Admitting: Physician Assistant

## 2025-01-06 ENCOUNTER — Ambulatory Visit: Payer: 59 | Admitting: Physician Assistant

## 2025-01-06 ENCOUNTER — Ambulatory Visit

## 2025-01-06 VITALS — BP 116/70 | HR 67 | Temp 98.2°F | Ht 65.0 in | Wt 153.2 lb

## 2025-01-06 DIAGNOSIS — E7800 Pure hypercholesterolemia, unspecified: Secondary | ICD-10-CM | POA: Diagnosis not present

## 2025-01-06 DIAGNOSIS — M79651 Pain in right thigh: Secondary | ICD-10-CM

## 2025-01-06 DIAGNOSIS — M8588 Other specified disorders of bone density and structure, other site: Secondary | ICD-10-CM

## 2025-01-06 DIAGNOSIS — Z0001 Encounter for general adult medical examination with abnormal findings: Secondary | ICD-10-CM | POA: Diagnosis not present

## 2025-01-06 LAB — CBC WITH DIFFERENTIAL/PLATELET
Basophils Absolute: 0.1 K/uL (ref 0.0–0.1)
Basophils Relative: 1.2 % (ref 0.0–3.0)
Eosinophils Absolute: 0.4 K/uL (ref 0.0–0.7)
Eosinophils Relative: 7.4 % — ABNORMAL HIGH (ref 0.0–5.0)
HCT: 38.3 % (ref 36.0–46.0)
Hemoglobin: 13.1 g/dL (ref 12.0–15.0)
Lymphocytes Relative: 27.6 % (ref 12.0–46.0)
Lymphs Abs: 1.4 K/uL (ref 0.7–4.0)
MCHC: 34.1 g/dL (ref 30.0–36.0)
MCV: 89.4 fl (ref 78.0–100.0)
Monocytes Absolute: 0.6 K/uL (ref 0.1–1.0)
Monocytes Relative: 11.5 % (ref 3.0–12.0)
Neutro Abs: 2.6 K/uL (ref 1.4–7.7)
Neutrophils Relative %: 52.3 % (ref 43.0–77.0)
Platelets: 322 K/uL (ref 150.0–400.0)
RBC: 4.28 Mil/uL (ref 3.87–5.11)
RDW: 13.1 % (ref 11.5–15.5)
WBC: 5 K/uL (ref 4.0–10.5)

## 2025-01-06 LAB — COMPREHENSIVE METABOLIC PANEL WITH GFR
ALT: 35 U/L (ref 3–35)
AST: 31 U/L (ref 5–37)
Albumin: 4.3 g/dL (ref 3.5–5.2)
Alkaline Phosphatase: 57 U/L (ref 39–117)
BUN: 17 mg/dL (ref 6–23)
CO2: 31 meq/L (ref 19–32)
Calcium: 9.8 mg/dL (ref 8.4–10.5)
Chloride: 103 meq/L (ref 96–112)
Creatinine, Ser: 0.67 mg/dL (ref 0.40–1.20)
GFR: 95.02 mL/min
Glucose, Bld: 92 mg/dL (ref 70–99)
Potassium: 4.9 meq/L (ref 3.5–5.1)
Sodium: 140 meq/L (ref 135–145)
Total Bilirubin: 0.4 mg/dL (ref 0.2–1.2)
Total Protein: 7.3 g/dL (ref 6.0–8.3)

## 2025-01-06 LAB — LIPID PANEL
Cholesterol: 157 mg/dL (ref 28–200)
HDL: 72.8 mg/dL
LDL Cholesterol: 69 mg/dL (ref 10–99)
NonHDL: 84.54
Total CHOL/HDL Ratio: 2
Triglycerides: 76 mg/dL (ref 10.0–149.0)
VLDL: 15.2 mg/dL (ref 0.0–40.0)

## 2025-01-06 LAB — TSH: TSH: 3.49 u[IU]/mL (ref 0.35–5.50)

## 2025-01-06 LAB — CK: Total CK: 115 U/L (ref 17–177)

## 2025-01-06 NOTE — Progress Notes (Signed)
 "  Subjective:    Kylie Miller is a 59 y.o. female and is here for a comprehensive physical exam.  HPI  Health Maintenance Due  Topic Date Due   Bone Density Scan  07/02/2021   Mammogram  09/28/2024    Discussed the use of AI scribe software for clinical note transcription with the patient, who gave verbal consent to proceed.  History of Present Illness   Kylie Miller is a 59 year old female with degenerative disc disease and osteopenia who presents for evaluation of bone health and thigh pain.  She is concerned about worsening bone density in her lumbar spine and hips on recent scans. She takes calcium  600 mg twice daily and vitamin D , with a vitamin D  level of 58 in November 2025. She does regular weight-bearing and resistance exercises.  She has dental implants with bone grafting and reports one graft is breaking down. She asks if this is related to her bone health.  She has right thigh pain that occurs mainly at night after heavy physical activity. Pain is severe at times and improves with topical cream. She feels it is different from prior hip flexor or IT band pain.  She takes rosuvastatin  and ezetimibe  for cholesterol. She previously had muscle weakness on atorvastatin , which resolved after changing to her current regimen.  Her father had vascular dementia. She is worried about her cognition but reports no memory problems or neurologic symptoms.       Health Maintenance: Immunizations -- UpToDate  Colonoscopy -- UpToDate  Mammogram -- UpToDate -- requesting records PAP -- UpToDate  Bone Density -- UpToDate  Diet -- overall healthy Exercise -- regular   Sleep habits -- no major concerns Mood -- overall stable  UTD with dentist? - yes UTD with eye doctor? - yes  Weight history: Wt Readings from Last 10 Encounters:  01/06/25 153 lb 4 oz (69.5 kg)  03/26/24 151 lb 4 oz (68.6 kg)  01/22/24 143 lb (64.9 kg)  01/18/24 144 lb (65.3 kg)  01/04/24 143 lb  9.6 oz (65.1 kg)  01/18/23 149 lb 9.6 oz (67.9 kg)  11/30/22 154 lb (69.9 kg)  08/03/22 152 lb 12.8 oz (69.3 kg)  07/13/22 152 lb 12.8 oz (69.3 kg)  11/29/21 152 lb (68.9 kg)   Body mass index is 25.5 kg/m. No LMP recorded. Patient has had a hysterectomy.  Alcohol use:  reports current alcohol use.  Tobacco use:  Tobacco Use: Low Risk (01/06/2025)   Patient History    Smoking Tobacco Use: Never    Smokeless Tobacco Use: Never    Passive Exposure: Past   Eligible for lung cancer screening? no     01/06/2025    9:07 AM  Depression screen PHQ 2/9  Decreased Interest 0  Down, Depressed, Hopeless 0  PHQ - 2 Score 0     Other providers/specialists: Patient Care Team: Job Lukes, [place] as PCP - General (Physician Assistant) Court Dorn PARAS, MD as PCP - Cardiology (Cardiology) Curlene Agent, MD as Consulting Physician (Obstetrics and Gynecology)    PMHx, SurgHx, SocialHx, Medications, and Allergies were reviewed in the Visit Navigator and updated as appropriate.   Past Medical History:  Diagnosis Date   Allergic rhinitis    Anxiety    WHEN GET ON PLANE   Family history of breast cancer    Family history of colon cancer    Family history of pancreatic cancer    Family history of thyroid  cancer  GERD (gastroesophageal reflux disease)    Heart murmur    NO ISSUES WITH IT   Hyperlipidemia    family history   IBS (irritable bowel syndrome)    resolved   Lumbar back pain    Vaginal delivery 1992     Past Surgical History:  Procedure Laterality Date   CESAREAN SECTION  1996   COLONOSCOPY     VAGINAL HYSTERECTOMY       Family History  Problem Relation Age of Onset   Breast cancer Mother 46   Pancreatic cancer Mother 62   Skin cancer Father        Basal cell that required Mohs and radiation   Dementia Father    Thyroid  cancer Maternal Grandmother        dx in her 4s   Heart disease Maternal Grandfather    Esophageal cancer Paternal Grandmother  46   Heart attack Paternal Grandmother 57   Stomach cancer Paternal Grandfather 64   Colon polyps Maternal Aunt    Colon cancer Maternal Aunt        dx between 56-70   Heart disease Paternal Aunt    Colon polyps Paternal Uncle    Colon cancer Paternal Uncle        dx late 60s-70   Colon polyps Paternal Uncle    Colon cancer Paternal Uncle    Cancer Other        PGF's brother with unknown cancer   Cancer Other        MGM's brother with unknown cancer   Crohn's disease Neg Hx    Rectal cancer Neg Hx     Social History[1]  Review of Systems:   Review of Systems  Constitutional:  Negative for chills, fever, malaise/fatigue and weight loss.  HENT:  Negative for hearing loss, sinus pain and sore throat.   Respiratory:  Negative for cough and hemoptysis.   Cardiovascular:  Negative for chest pain, palpitations, leg swelling and PND.  Gastrointestinal:  Negative for abdominal pain, constipation, diarrhea, heartburn, nausea and vomiting.  Genitourinary:  Negative for dysuria, frequency and urgency.  Musculoskeletal:  Negative for back pain, myalgias and neck pain.       Right thigh pain  Skin:  Negative for itching and rash.  Neurological:  Negative for dizziness, tingling, seizures and headaches.  Endo/Heme/Allergies:  Negative for polydipsia.  Psychiatric/Behavioral:  Negative for depression. The patient is not nervous/anxious.     Objective:   BP 116/70 (BP Location: Left Arm, Patient Position: Sitting, Cuff Size: Normal)   Pulse 67   Temp 98.2 F (36.8 C) (Temporal)   Ht 5' 5 (1.651 m)   Wt 153 lb 4 oz (69.5 kg)   SpO2 97%   BMI 25.50 kg/m  Body mass index is 25.5 kg/m.   General Appearance:    Alert, cooperative, no distress, appears stated age  Head:    Normocephalic, without obvious abnormality, atraumatic  Eyes:    PERRL, conjunctiva/corneas clear, EOM's intact, fundi    benign, both eyes  Ears:    Normal TM's and external ear canals, both ears  Nose:    Nares normal, septum midline, mucosa normal, no drainage    or sinus tenderness  Throat:   Lips, mucosa, and tongue normal; teeth and gums normal  Neck:   Supple, symmetrical, trachea midline, no adenopathy;    thyroid :  no enlargement/tenderness/nodules; no carotid   bruit or JVD  Back:     Symmetric, no curvature, ROM normal,  no CVA tenderness  Lungs:     Clear to auscultation bilaterally, respirations unlabored  Chest Wall:    No tenderness or deformity   Heart:    Regular rate and rhythm, S1 and S2 normal, no murmur, rub or gallop  Breast Exam:    Deferred   Abdomen:     Soft, non-tender, bowel sounds active all four quadrants,    no masses, no organomegaly  Genitalia:    Deferred  Extremities:   Extremities normal, atraumatic, no cyanosis or edema  Pulses:   2+ and symmetric all extremities  Skin:   Skin color, texture, turgor normal, no rashes or lesions  Lymph nodes:   Cervical, supraclavicular, and axillary nodes normal  Neurologic:   CNII-XII intact, normal strength, sensation and reflexes    throughout    Assessment/Plan:   Assessment and Plan    Encounter for general adult medical examination with abnormal findings  Today patient counseled on age appropriate routine health concerns for screening and prevention, each reviewed and up to date or declined. Immunizations reviewed and up to date or declined. Labs ordered and reviewed. Risk factors for depression reviewed and negative. Hearing function and visual acuity are intact. ADLs screened and addressed as needed. Functional ability and level of safety reviewed and appropriate. Education, counseling and referrals performed based on assessed risks today. Patient provided with a copy of personalized plan for preventive services.  Osteopenia of lumbar spine Osteopenia in lumbar spine with adequate vitamin D  level and managed calcium  intake. Family history of osteopenia noted. No medication-related bone loss. - Continue calcium   and vitamin D  supplementation. - Encouraged strength training with weighted vest and leg exercises. - Schedule follow-up bone density scan in two years.  Right thigh pain Intermittent right thigh pain, possibly due to muscle strain, IT band syndrome, statin myopathy, or other musculoskeletal issues.  - Ordered x-ray of right thigh   Pure hypercholesterolemia Managed with rosuvastatin  and ezetimibe . Previous atorvastatin  caused muscle weakness. Current regimen effective but concerns about statin-related muscle pain. Insurance issues with Rep[employer]. - Checked creatinine kinase for statin-induced myopathy. - Continue current cholesterol management. - Discuss potential statin dose adjustment or alternative therapy if creatinine kinase elevated.      Lucie Buttner, PA-C [facility]      [1]  Social History Tobacco Use   Smoking status: Never    Passive exposure: Past (as achild,mother smoked)   Smokeless tobacco: Never  Vaping Use   Vaping status: Never Used  Substance Use Topics   Alcohol use: Yes    Comment: 1 or less daily wine   Drug use: No   "

## 2025-01-07 ENCOUNTER — Ambulatory Visit: Payer: Self-pay | Admitting: Physician Assistant

## 2025-01-07 DIAGNOSIS — R936 Abnormal findings on diagnostic imaging of limbs: Secondary | ICD-10-CM

## 2025-01-07 DIAGNOSIS — M79651 Pain in right thigh: Secondary | ICD-10-CM

## 2025-01-14 ENCOUNTER — Encounter: Payer: Self-pay | Admitting: Physician Assistant

## 2025-01-14 MED ORDER — ALPRAZOLAM 0.5 MG PO TABS: ORAL_TABLET | ORAL | 0 refills | Status: AC

## 2025-01-15 NOTE — Progress Notes (Unsigned)
 "        I, Kylie Collet, PhD, LAT, ATC acting as a scribe for Kylie Lloyd, MD.  Kylie Miller is a 59 y.o. female who presents to [facility] today for R thigh pain x 2 months. Pain seems to occur after she re-started heavy physical activity. Pt locates pain to the anterior aspect of her R thigh. Hx of lumbar DDD. Pain will wake her up at night.  She does note that she is managed with rosuvastatin  40 mg and 10 mg of Zetia  for her hypercholesterolemia and is experiencing thigh pain.  She had similar thigh pain when she was taking Lipitor.  She has been on these medicines for a while so was not sure if the thigh pain is due to the medicines or not.  Additionally she notes the thigh pain is really only on the right side so was again not sure about medication cause.  She does note that she had a friend who had similar thigh pain and ultimately was found to have a sarcoma so she is quite anxious about source of this pain.  Treatments tried: creams, massage  Dx testing: 01/06/25 R femur XR  Pertinent review of systems: No fevers or chills  Relevant historical information: Coronary artery disease and hyperlipidemia.   Exam:  BP 118/76   Pulse 87   Ht 5' 5 (1.651 m)   Wt 154 lb (69.9 kg)   SpO2 96%   BMI 25.63 kg/m  General: Well Developed, well nourished, and in no acute distress.   MSK: Right thigh normal.  Not particular tender to palpation.  Normal motion and strength.    Lab and Radiology Results  EXAM: DG FEMUR 2+V*R*   COMPARISON:  None Available.   FINDINGS: There is no evidence of fracture or other focal bone lesions. Multiple small rounded calcifications are seen in the soft tissues lateral to proximal femur most likely chronic sequela of prior inflammation or trauma.   IMPRESSION: No acute abnormality seen.     Electronically Signed   By: Lynwood Landy Raddle M.D.   On: 01/10/2025 08:33 I, Kylie Miller, personally (independently)  visualized and performed the interpretation of the images attached in this note.  Recent Results (from the past 2160 hours)  CBC with Differential/Platelet     Status: Abnormal   Collection Time: 01/06/25 10:15 AM  Result Value Ref Range   WBC 5.0 4.0 - 10.5 K/uL   RBC 4.28 3.87 - 5.11 Mil/uL   Hemoglobin 13.1 12.0 - 15.0 g/dL   HCT 61.6 63.9 - 53.9 %   MCV 89.4 78.0 - 100.0 fl   MCHC 34.1 30.0 - 36.0 g/dL   RDW 86.8 88.4 - 84.4 %   Platelets 322.0 150.0 - 400.0 K/uL   Neutrophils Relative % 52.3 43.0 - 77.0 %   Lymphocytes Relative 27.6 12.0 - 46.0 %   Monocytes Relative 11.5 3.0 - 12.0 %   Eosinophils Relative 7.4 (H) 0.0 - 5.0 %   Basophils Relative 1.2 0.0 - 3.0 %   Neutro Abs 2.6 1.4 - 7.7 K/uL   Lymphs Abs 1.4 0.7 - 4.0 K/uL   Monocytes Absolute 0.6 0.1 - 1.0 K/uL   Eosinophils Absolute 0.4 0.0 - 0.7 K/uL   Basophils Absolute 0.1 0.0 - 0.1 K/uL  Comprehensive metabolic panel with GFR     Status: None   Collection Time: 01/06/25 10:15 AM  Result Value Ref Range   Sodium 140 135 -  145 mEq/L   Potassium 4.9 3.5 - 5.1 mEq/L   Chloride 103 96 - 112 mEq/L   CO2 31 19 - 32 mEq/L    Comment: Elevated LDH levels may cause falsely increased CO2 results. If LDH is >2000 U/L, a positive bias of 12% is possible.   Glucose, Bld 92 70 - 99 mg/dL   BUN 17 6 - 23 mg/dL   Creatinine, Ser 9.32 0.40 - 1.20 mg/dL   Total Bilirubin 0.4 0.2 - 1.2 mg/dL   Alkaline Phosphatase 57 39 - 117 U/L   AST 31 5 - 37 U/L   ALT 35 3 - 35 U/L   Total Protein 7.3 6.0 - 8.3 g/dL   Albumin 4.3 3.5 - 5.2 g/dL   GFR 04.97 >39.99 mL/min    Comment: Calculated using the CKD-EPI Creatinine Equation (2021)   Calcium  9.8 8.4 - 10.5 mg/dL  TSH     Status: None   Collection Time: 01/06/25 10:15 AM  Result Value Ref Range   TSH 3.49 0.35 - 5.50 uIU/mL  Lipid panel     Status: None   Collection Time: 01/06/25 10:15 AM  Result Value Ref Range   Cholesterol 157 28 - 200 mg/dL    Comment: ATP III Classification        Desirable:  < 200 mg/dL               Borderline High:  200 - 239 mg/dL          High:  > = 759 mg/dL   Triglycerides 23.9 89.9 - 149.0 mg/dL    Comment: Normal:  <849 mg/dLBorderline High:  150 - 199 mg/dL   HDL 27.19 >60.99 mg/dL   VLDL 84.7 0.0 - 59.9 mg/dL   LDL Cholesterol 69 10 - 99 mg/dL   Total CHOL/HDL Ratio 2     Comment:                Men          Women1/2 Average Risk     3.4          3.3Average Risk          5.0          4.42X Average Risk          9.6          7.13X Average Risk          15.0          11.0                       NonHDL 84.54     Comment: NOTE:  Non-HDL goal should be 30 mg/dL higher than patient's LDL goal (i.e. LDL goal of < 70 mg/dL, would have non-HDL goal of < 100 mg/dL)  CK (Creatine Kinase)     Status: None   Collection Time: 01/06/25 10:15 AM  Result Value Ref Range   Total CK 115 17 - 177 U/L       Assessment and Plan: 59 y.o. female with right thigh pain associated with calcification seen on x-ray.  There are multiple differential diagnoses options that could explain her pain.  Most likely is statin related myalgia.  She is following up with cardiology next week and I anticipate that she will be switched to Repatha or similar.  However there is a possibility that the calcifications seen on the x-ray are the source of her pain.  There  is not a great explanation for the calcifications based on the x-ray.  Will go ahead and proceed with MRI with and without contrast of the right femur to evaluate calcifications and source of thigh pain.   PDMP not reviewed this encounter. Orders Placed This Encounter  Procedures   US  LIMITED JOINT SPACE STRUCTURES LOW RIGHT(NO LINKED CHARGES)    Reason for Exam (SYMPTOM  OR DIAGNOSIS REQUIRED):   right thigh pain    Preferred imaging location?:   [facility]   MR FEMUR RIGHT W WO CONTRAST    Standing Status:   Future    Expiration Date:   01/16/2026    If indicated for the ordered  procedure, I authorize the administration of contrast media per Radiology protocol:   Yes    What is the patient's sedation requirement?:   No Sedation    Does the patient have a pacemaker or implanted devices?:   No    Preferred imaging location?:   [facility]   No orders of the defined types were placed in this encounter.    Discussed warning signs or symptoms. Please see discharge instructions. Patient expresses understanding.   The above documentation has been reviewed and is accurate and complete Kylie Miller, M.D.   "

## 2025-01-16 ENCOUNTER — Other Ambulatory Visit: Payer: Self-pay

## 2025-01-16 ENCOUNTER — Ambulatory Visit: Admitting: Family Medicine

## 2025-01-16 VITALS — BP 118/76 | HR 87 | Ht 65.0 in | Wt 154.0 lb

## 2025-01-16 DIAGNOSIS — M61451 Other calcification of muscle, right thigh: Secondary | ICD-10-CM

## 2025-01-16 DIAGNOSIS — M791 Myalgia, unspecified site: Secondary | ICD-10-CM | POA: Diagnosis not present

## 2025-01-16 DIAGNOSIS — M79651 Pain in right thigh: Secondary | ICD-10-CM

## 2025-01-16 NOTE — Patient Instructions (Addendum)
 Thank you for coming in today.  You should hear from MRI scheduling within 1 week. If you do not hear please let me know.

## 2025-01-21 ENCOUNTER — Encounter: Payer: Self-pay | Admitting: Cardiovascular Disease

## 2025-01-21 ENCOUNTER — Ambulatory Visit: Admitting: Cardiovascular Disease

## 2025-01-21 VITALS — BP 118/60 | HR 70 | Ht 65.0 in | Wt 156.2 lb

## 2025-01-21 DIAGNOSIS — E7800 Pure hypercholesterolemia, unspecified: Secondary | ICD-10-CM

## 2025-01-21 DIAGNOSIS — R002 Palpitations: Secondary | ICD-10-CM

## 2025-01-21 DIAGNOSIS — I2583 Coronary atherosclerosis due to lipid rich plaque: Secondary | ICD-10-CM | POA: Diagnosis not present

## 2025-01-21 DIAGNOSIS — R5383 Other fatigue: Secondary | ICD-10-CM

## 2025-01-21 DIAGNOSIS — I2510 Atherosclerotic heart disease of native coronary artery without angina pectoris: Secondary | ICD-10-CM

## 2025-01-21 NOTE — Progress Notes (Signed)
 "     01/21/2025 Kylie Miller   10/09/1966  [id number]  Primary Physician Job Lukes, PA Primary Cardiologist: Dorn JINNY Lesches MD [facility] [place], [place]  HPI:  Kylie Miller is a 59 y.o.  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  She was referred by Lukes Job, PA-C for elevated coronary calcium  score.  I last saw her in the office 01/22/2024. Her only risk factor for CAD is hyperlipidemia on statin therapy.  She does not smoke.  She drinks wine daily.  There is no family history for heart disease.  She is never had a heart tach or stroke.  She denies chest pain or shortness of breath.  Recent coronary calcium  score performed 12/05/2019 was 113.  Her most recent LDL on simvastatin  20 was 145 on 11/20/2019.  She complains of occasional flutters  in her chest although she does drink over 4 cups of caffeinated beverages a day.   She decreased her caffeine intake at my request her palpitations have resolved.  She did change her simvastatin  to rosuvastatin  because of elevated LDL in the setting of moderately elevated coronary calcium  score which resulted in marked improvement in her lipid profile.  This was performed 03/03/2020 revealing a total cholesterol of 179, LDL of 82 and HDL of 84.   Since I saw her a year ago she is remained stable.  She still active, walks over 10,000 steps a day and does aerobics.  Her PCP added ezetimibe  to her lipid profile which resulted in significant improvement with a total of 157, LDL 69 HDL of 72, at goal for secondary prevention.  Of last 2 months however she has noticed anterior thigh discomfort mostly at night similar to the symptoms she had when she was on atorvastatin .   Active Medications[1]   Allergies[2]  Social History   Socioeconomic History   Marital status: Married    Spouse  name: david    Number of children: 2   Years of education: Not on file   Highest education level: Not on file  Occupational History   Occupation: Conservation Officer, Historic Buildings: [employer]  Tobacco Use   Smoking status: Never    Passive exposure: Past (as achild,mother smoked)   Smokeless tobacco: Never  Vaping Use   Vaping status: Never Used  Substance and Sexual Activity   Alcohol use: Yes    Comment: 1 or less daily wine   Drug use: No   Sexual activity: Not on file  Other Topics Concern   Not on file  Social History Narrative   Not on file   Social Drivers of Health   Tobacco Use: Low Risk (01/21/2025)   Patient History    Smoking Tobacco Use: Never    Smokeless Tobacco Use: Never    Passive Exposure: Past  Financial Resource Strain: Not on file  Food Insecurity: Not on file  Transportation Needs: Not on file  Physical Activity: Not on file  Stress: Not on file  Social Connections: Not on file  Intimate Partner Violence: Not on file  Depression (PHQ2-9): Low Risk (01/06/2025)   Depression (PHQ2-9)    PHQ-2 Score: 0  Alcohol Screen: Not on file  Housing: Not on file  Utilities: Not on file  Health Literacy: Not on file     Review of Systems:  General: negative for chills, fever, night sweats or weight changes.  Cardiovascular: negative for chest pain, dyspnea on exertion, edema, orthopnea, palpitations, paroxysmal nocturnal dyspnea or shortness of breath Dermatological: negative for rash Respiratory: negative for cough or wheezing Urologic: negative for hematuria Abdominal: negative for nausea, vomiting, diarrhea, bright red blood per rectum, melena, or hematemesis Neurologic: negative for visual changes, syncope, or dizziness All other systems reviewed and are otherwise negative except as noted above.    Blood pressure 118/60, pulse 70, height 5' 5 (1.651 m), weight 156 lb 3.2 oz (70.9 kg), SpO2 97%.  General appearance: alert and no distress Neck:  no adenopathy, no carotid bruit, no JVD, supple, symmetrical, trachea midline, and thyroid  not enlarged, symmetric, no tenderness/mass/nodules Lungs: clear to auscultation bilaterally Heart: regular rate and rhythm, S1, S2 normal, no murmur, click, rub or gallop Extremities: extremities normal, atraumatic, no cyanosis or edema Pulses: 2+ and symmetric Skin: Skin color, texture, turgor normal. No rashes or lesions Neurologic: Grossly normal  EKG EKG Interpretation Date/Time:  Tuesday January 21 2025 08:54:12 EST Ventricular Rate:  68 PR Interval:  180 QRS Duration:  94 QT Interval:  382 QTC Calculation: 406 R Axis:   39  Text Interpretation: Normal sinus rhythm Septal infarct , age undetermined When compared with ECG of 22-Jan-2024 09:31, Septal infarct is now Present Confirmed by Court Carrier [phone]) on 01/21/2025 8:56:33 AM    ASSESSMENT AND PLAN:   HYPERCHOLESTEROLEMIA History of hyperlipidemia intolerant to atorvastatin  currently on Zetia  and high-dose rosuvastatin  complaining of some new onset leg pain over the last several months mostly at night.  Her most recent lipid profile performed 01/06/2025 revealed total cholesterol 157, LDL 69 and HDL of 72.  I am going to give her 62-month statin holiday and have her see a Pharm.D. back after that.  If indeed she is statin intolerant she would be a candidate for a PCSK9 such as Repatha.  CAD (coronary artery disease) History of CAD with coronary calcium  score of 113 located in the LAD and RCA.  She is active and asymptomatic and at goal for secondary prevention.     Carrier DOROTHA Court MD FACP,FACC,FAHA, FSCAI 01/21/2025 9:08 AM    [1]  Current Meds  Medication Sig   ALPRAZolam  (XANAX ) 0.5 MG tablet TAKE 1 TABLET BY MOUTH AT  BEDTIME AS NEEDED FOR  ANXIETY   calcium  carbonate (OS-CAL) 1250 (500 Ca) MG chewable tablet Chew 1 tablet by mouth daily.   Cholecalciferol (VITAMIN D3) 50 MCG (2000 UT) TABS Take by mouth daily.   ezetimibe   (ZETIA ) 10 MG tablet Take 1 tablet (10 mg total) by mouth daily.   folic acid (FOLVITE) 400 MCG tablet Take 400 mcg by mouth daily.   glucosamine-chondroitin 500-400 MG tablet Take 1 tablet by mouth at bedtime. 2000 mg   Multiple Vitamins-Minerals (MULTIVITAMIN WITH MINERALS) tablet Take 1 tablet by mouth daily.   rosuvastatin  (CRESTOR ) 40 MG tablet Take 1 tablet (40 mg total) by mouth daily.  [2]  Allergies Allergen Reactions   Codeine     REACTION: nausea   "

## 2025-01-21 NOTE — Assessment & Plan Note (Signed)
 History of hyperlipidemia intolerant to atorvastatin  currently on Zetia  and high-dose rosuvastatin  complaining of some new onset leg pain over the last several months mostly at night.  Her most recent lipid profile performed 01/06/2025 revealed total cholesterol 157, LDL 69 and HDL of 72.  I am going to give her 4-month statin holiday and have her see a Pharm.D. back after that.  If indeed she is statin intolerant she would be a candidate for a PCSK9 such as Repatha.

## 2025-01-21 NOTE — Assessment & Plan Note (Signed)
 History of CAD with coronary calcium  score of 113 located in the LAD and RCA.  She is active and asymptomatic and at goal for secondary prevention.

## 2025-01-22 ENCOUNTER — Encounter: Payer: Self-pay | Admitting: Cardiovascular Disease

## 2025-01-22 ENCOUNTER — Ambulatory Visit: Admitting: Cardiovascular Disease

## 2025-01-22 ENCOUNTER — Encounter: Payer: Self-pay | Admitting: Family Medicine

## 2025-01-22 DIAGNOSIS — M79651 Pain in right thigh: Secondary | ICD-10-CM

## 2025-01-22 DIAGNOSIS — M61451 Other calcification of muscle, right thigh: Secondary | ICD-10-CM

## 2025-01-22 DIAGNOSIS — M791 Myalgia, unspecified site: Secondary | ICD-10-CM

## 2025-01-27 ENCOUNTER — Other Ambulatory Visit

## 2025-03-18 ENCOUNTER — Ambulatory Visit: Admitting: Pharmacist Clinician (PhC)/ Clinical Pharmacy Specialist

## 2026-01-07 ENCOUNTER — Encounter: Admitting: Physician Assistant
